# Patient Record
Sex: Male | Born: 1990 | Race: White | Hispanic: No | Marital: Single | Smoking: Current every day smoker
Health system: Southern US, Community
[De-identification: ages and names within clinical notes are randomized; demographics above are authoritative.]

## PROBLEM LIST (undated history)

## (undated) DIAGNOSIS — F329 Major depressive disorder, single episode, unspecified: Secondary | ICD-10-CM

## (undated) DIAGNOSIS — F32A Depression, unspecified: Secondary | ICD-10-CM

## (undated) DIAGNOSIS — F39 Unspecified mood [affective] disorder: Secondary | ICD-10-CM

## (undated) DIAGNOSIS — F909 Attention-deficit hyperactivity disorder, unspecified type: Secondary | ICD-10-CM

## (undated) DIAGNOSIS — F1110 Opioid abuse, uncomplicated: Secondary | ICD-10-CM

## (undated) DIAGNOSIS — F419 Anxiety disorder, unspecified: Secondary | ICD-10-CM

## (undated) HISTORY — DX: Anxiety disorder, unspecified: F41.9

## (undated) HISTORY — DX: Depression, unspecified: F32.A

## (undated) HISTORY — DX: Major depressive disorder, single episode, unspecified: F32.9

---
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
## 2010-08-31 NOTE — H&P (Signed)
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 Betti Cruz for inpatient  stabilization and treatment of suicide risk and depression, complicating  treatment of complex ADHD and substance abuse.  The patient had overdosed  with mother's Klonopin the night before admission, taking five of her  tablets, and then ran away, requiring the police to bring him home.  The  following morning, he was verbally assaultive and destroying properties in  ways that were dangerous to self and others.  He reported two previous  overdoses with mother's pills in the past and had planned to do so again.  He would not contract for safety and reported he was out of control in his  urge for substance abuse.   SYNOPSIS OF PRESENT ILLNESS:  The patient offers little elaboration on the  origin and course of symptoms, even though he presents himself as being in  the top 12% of IQ's around the country.  He reports having severe ADHD, as  diagnosed by Dr. Greig Castilla Proffer, even though his IQ was measured at the top.  The patient does not appear to help himself significantly, even though he  seems proud of his high IQ.  He has had a gradual erosion in his social  organization and behavior so that he is progressively consequential to  family life.  He has overdosed on prednisone, May 06, 2005, and was seen  in [facility] emergency department, indicating he was trying to  hurt himself because he was always causing problems, including for the  family.  He therefore seems to  experience some guilt and remorse for his  actions and to have some insight into the consequences; however, he has not  resolved such.  He was to see Dr. Betti Cruz later that day after being presented  to the emergency department by family.  He has a history of ADHD, substance  abuse, depression.  He has had significant defiant behavior, including  property destruction, running away, and stealing.  At the time of admission,  he is taking Ritalin at 40 mg LA, he has 2 in the morning and 1 at noon.  He  therefore takes a total of 120 mg of Ritalin daily for approximately 1.5  mg/kg per day.  The family indicates that great efforts have been taken by  Dr. Betti Cruz to find the right dose of medication for the patient, and they do  not want the medication changed.  He is also on clonidine 0.1 mg tablet,  taking a half tablet three times daily in the morning, noon, and before  supper, as well as a whole tablet at bedtime.  He also takes Lexapro 20 mg  every morning.  Patient takes little responsibility but exhibits significant  denial relative to symptoms.  He portrays being significantly  psychologically habituated to substance use, even though he mechanically  states he has used only a few times regarding cannabis.  He has apparently  distributed clonazepam at school and been grounded for a month.  His  psychological fixations and compulsions to use are much greater than the  physical compulsions.  He suggests at one time that he has a couple of  cigarettes daily and another time that he has 6-15 cigarettes daily.  He  indicates a desperate desire for a patch; however, with his high dose  Ritalin and complex pharmacotherapy, a potential interaction with the patch  may be significant.  We will check an EKG prior to proceeding.  He  apparently had psychological care with Dr. Inez Pilgrim in the past but sees Dr.  Betti Cruz regularly.  He has had a Public relations account executive at school, Benjie Karvonen.  He does not  acknowledge current misperceptions.  He does not know  acknowledge post-traumatic anxiety or flashbacks.   PAST MEDICAL HISTORY:  The patient is under the primary care of Dr. Ermalinda Barrios.  He had the prednisone overdose treated in the emergency  department on May 06, 2005, at which time his weight was 78.7 kg,  currently 75.5.  He had chickenpox at age 28.  He is sensitive to pepperoni  with dyspepsia.  He is otherwise in good general health.  He has had no  seizure or syncope.  He has had no heart murmur or arrhythmia.  He has no  medication allergies.  There has been no other known organic central nervous  system trauma.   REVIEW OF SYSTEMS:  Patient denies difficulty with gait, gaze, or  continence.  He denies exposure to communicable disease or toxins.  He has  no headache or sensory loss.  He has no memory disturbance or coordination  deficit.  He has no rash, jaundice, or purpura.  There is no chest pain,  palpitations, or presyncope.  There is no abdominal pain, nausea, vomiting,  or diarrhea.  There is no dysuria or arthralgia.  Immunizations are up to  date.   FAMILY HISTORY:  The patient lives with parents and sister.  Mother has  pills in the house, but they do not clarify what and why, except she does  have clonazepam, with which the patient is overdosed.  Family history is  otherwise unremarkable or currently repressed and suppressed.   SOCIAL/DEVELOPMENTAL HISTORY:  Patient is a 10th grade student at [employer].  He offers little clarification of his status at school,  although stating that his ADHD has been severe in the past.  He does not  acknowledge definite legal consequences except that the police brought him  home when he ran away the night before admission without apparent charges.  The patient does not acknowledge sexual activity at this time, although he is not open to answering questions.  He maintains the drug using, social  posture,  seeking Nicoderm patch.   ASSETS:  Patient is intelligent.   MENTAL STATUS EXAM:  Height is 68-1/2 inches, and weight is 75.5 kg.  Blood  pressure is 116/74 with heart rate of 74 sitting and 126/73 with heart rate  of 93 standing.  He is right-handed.  He is regressive and hysteroid, and  his identify to fusion and social manipulation.  He presents initial  moderate-to-severe dysphoria with irritability and impulse control  difficulty.  He has atypical depressive features, although he does present  some inappropriate over-animation at times, it is difficult to distinguish  primary and defensive actions and reactions.  Overall, the patient appears  to have moderate-to-severe atypical major depression.  Patient has suicidal  ideation and planned overdose with mother's pills.  He has a history of  externalizing behavior, including ADHD and ODD.  Communication and respect  antd trust for others and self has been poor with few boundaries for  maintaining safety, and he does not contract for safety relative to suicide  plan to overdose with pills.   IMPRESSION:   AXIS I:  1. 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  2. Attention-deficit/hyperactivity disorder, combined type, severe.  3. Oppositional defiant disorder.  4. Psychoactive substance abuse, not otherwise specified.  5. Identify disorder with hysteroid features.  6. Parent-child problem.  7. Other specified family circumstances.  8. Other interpersonal problems.   AXIS II:  Deferred.   AXIS III:  Cigarette smoking.   AXIS IV:  Stressors, family, severe acute-on-chronic; school, moderate acute-  on-chronic; phase of life, severe acute-on-chronic.   AXIS V:  GAF on admission 36 with highest in last year 67.   PLAN:  Patient is admitted for inpatient adolescent psychiatric and  multidisciplinary multimodal behavioral health treatment in a  team-based  programmatic locked psychiatric unit.  Will  consider reduction of Ritalin,  as indicated, according to black box warnings and clinical course.  The most  important appears to be stabilization of dangerous disruptive behavior and  family function consequences as well as sobriety for resolving factors that  continue to increase depression.  Will check an EKG prior to any  consideration of Nicoderm patch.  Will continue current medications  otherwise initially.  Cognitive behavioral therapy, anger management, family  therapy, substance abuse intervention, social and communication skills,  problem solving and coping skills, identify consolidation, and  individuation/separation can be undertaken.  Estimated length of treatment  is 7-8 days with target symptoms for discharge being stabilization of  suicide risk and mood, stabilization of dangerous disruptive behavior, and  generalization of the capacity for safe, effective participation in outpatient treatment.      Lalla Brothers, MD  Electronically Signed     GEJ/MEDQ  D:  01/14/2006  T:  01/15/2006  Job:  [id number]

## 2010-08-31 NOTE — Discharge Summary (Signed)
NAMEDEQUANE, STRAHAN               ACCOUNT NO.:  [id number]   MEDICAL RECORD NO.:  [id number]          PATIENT TYPE:  INP   LOCATION:  0204                          FACILITY:  [facility]   PHYSICIAN:  Lalla Brothers, MDDATE OF BIRTH:  [birth year]   DATE OF ADMISSION:  06/06/2006  DATE OF DISCHARGE:  06/11/2006                               DISCHARGE SUMMARY   IDENTIFICATION:  A 44-1/20-year-old male tenth grade student at [employer] was admitted emergently voluntarily in  transfer from [facility] Emergency Department for  inpatient stabilization and treatment of suicide risk, dangerous  disruptive intoxication and criminal behavior undermining of mood  disorder vise versa.  The patient had multiple self-inflicted  lacerations on the left dorsal forearm occurring just after being  charged by police for breaking and entering cars for which purpose the  patient states was to look for cigarettes.  The patient indicated on  admission that he was very high and would be hungover the next day.  He  was progressively out of control recently, including being seen in  [facility] Department 2 days before when father caught him  with 2 stacks of pills, being very intoxicated.  For full details,  please see the typed admission assessment.   SYNOPSIS OF PRESENT ILLNESS:  Parents are pleased that the patient was  able to get into [employer] after his last hospitalization and discharge at  [facility] February 05, 2006.  However, parents note that 2  weeks prior to current admission, the patient started refusing school  and refusing to take his proper medication.  He became more agitated and  belligerent, and was smoking more cigarettes and cannabis.  The patient  attempts to abuse mother's Klonopin medication, and to overdose on  Ritalin.  He was expelled from [employer] in [place]  after being found walking down the road  with a firearm, talking about  harming 3 peers from his school who had been bullying him.  In the  interim since that last hospitalization, the patient's Ritalin has again  advanced to 40 mg t.i.d., up from previous b.i.d. at the time of  discharge, and his Lexapro has been restarted now at 20 mg every  morning.  His clonidine is  0.05 mg t.i.d. and 0.1 mg at bedtime, and he  is using Zyprexa 5 mg only p.r.n..  He has grown and gained weight  significantly since October2007 with weight up from 76.5 kg to 90 kg.  His height is up from 174 cm to 176 cm.  The patient reports a right  ankle sprain at the time of admission from playing basketball prior to  admission.  He is clumsy in his agitated manic state, even though he  reports dysphoria, and he has also gained significant weight, putting  additional stress on his ankle.  Father and sister likely have ADHD.  Mother has Klonopin, reporting previous postpartum depression.  Family  has exhausted large sums of money attempting to stabilize the patient's  mental health and behavioral problems  as well as to work out the  consequences.  The patient had reported overdosing with 10 Klonopin of  mother's when he went to the emergency department 2 days prior to his  current admission, but he had no benzodiazepines in his urine drug  screen, only cannabis.  However, at the time of this admission he does  have benzodiazepines as well as cannabis in his urine drug screen.   INITIAL MENTAL STATUS EXAM:  The patient is exhibiting hyperverbal  communication and intrusiveness with little social respect or empathy.  He has high expressed emotion, even about hip-pop music and activity.  The patient is somewhat more mature and capable than was his  interpersonal behavior during his last admission 5 months ago.  However,  patient has been abusing his Ritalin at times as well as mother's  Klonopin.  He has made suicide threats and self-mutilated his left   forearm dorsal aspect.   LABORATORY FINDINGS:  CBC at [facility] was normal with white count  7700, hemoglobin 14, MCV of 80 with reference range 78-92, and platelet  count 228,000.  Initial basic metabolic panel was normal with sodium  141, potassium low at 3.4 with lower limit of normal 3.5, random glucose  110, creatinine 0.7, and calcium 9.6.  A repeat comprehensive metabolic  panel the day before discharge including in comparison to hepatic  function panel 3 days before was normal with sodium 142, potassium 4.5,  glucose 92, CO2 30, creatinine 0.89, total bilirubin 0.9, calcium 9.8,  albumin 3.6, AST 24, ALT 17 and GGT 21.  Hemoglobin A1c was normal at  5.7% with reference range 4.6-6.1.  A 10-hour fasting lipid panel was  mildly abnormal with total cholesterol 189 with upper limit of normal  169, HDL normal at 39, LDL slightly elevated 112 with normal being less  than 109, and triglyceride at 10-hour fasting 191 with normal being less  than 150 for 14-hour fast.  Free T4 was slightly low at 0.85 ng/dL with  reference range 0.45-4.0.  TSH was normal at 1.753 with reference range  0.35-5.5.  Acetaminophen and salicylate have been negative in the ED,  though urine drug screen was positive for tetrahydrocannabinol and  benzodiazepines, while blood alcohol was negative.  Urinalysis was  normal with specific gravity of 1.017 and pH 6.5.  RPR was nonreactive.  Urine probe for gonorrhea and Chlamydia trachomatis by DNA amplification  were both negative.   HOSPITAL COURSE AND TREATMENT:  General medical exam by Jorje Guild, PA-C,  exam of the right ankle and repeated exam several days later ultimately  clearing him for graduated recreational activity on that right ankle.  He reported a 30-pound weight gain in 2 months himself, and his BMI was  29.1, appearing obese.  Ibuprofen was arranged as needed for ankle discomfort and was not often needed.  Vital signs were normal throughout  hospital  stay with height of 176 cm and weight of 90 kg on admission.  Blood pressure initially was 129/77 with heart rate of 82 supine, and  123/77 with heart rate of 92 standing.  At the time of discharge, supine  blood pressure was 117/71 with heart rate of 78, and standing blood  pressure 122/70 with heart rate of 82.  The patient initially was  discontinued from Ritalin LA, and Lexapro was reduced to 10 mg every  morning.  Clonidine was continued and Zyprexa Zydis was gradually  increased to 10 mg every night.  The patient gradually  developed some  insight for consequences of his delinquent behavior though he continued  to be grandiose and expansive about most other aspects of his life.  However, he began to have the capacity to follow the redirection of  others and began to explore his abnormal behaviors as well as the  consequences of missing parts of activity and social exposure that could  have been earned by appropriate behavior.  Family planned mental health  court for the patient's illegal behaviors prior to admission.  However  the treatment staff and program was much more direct in clarifying the  illegal aspect of the patient's behavior and the possible and severe  consequences.  Such could not be verified during the patient's hospital  treatment.  The patient tolerated increased Zyprexa and decreased  Lexapro, followed by restarting a lower dose of the Ritalin LA.  The  patient required no seclusion or restraint during hospital stay, though  he did require as-needed doses of Zyprexa Zydis and a Nicoderm patch 21  mg.  However, on the established regimen, the patient gradually  stabilized sufficiently to resume his out-of-hospital status with  parents, though with extensive family therapy and individual  interventions to stabilize his disruptive behavior.  The patient showed  little or no remorse for his actions, but  he did respect by the time of  discharge the prohibition of a  subsequent similar behavior.  However,  generalization is a major task, though this was initially addressed with  family intervention with mother and father.  The patient's total  cholesterol had risen to 189 from 160 in October2007 with HDL  cholesterol down from 49 to 39, and LDL up from 93 to 112.  The  patient's wounds were 70% healed by the time of discharge, even though  he was refusing wound care most of the time.  His judgment remain poor  as though chronically manic though he had mixed mood features as well,  including some significant intrapsychic dysphoria at times that he  denied.  The patient was ready for discharge, reaching the maximum  hospital benefit though having very significant long-term treatment  needs.  In the work with the family, there was partial acceptance among  family members that the patient needed group home placement, wilderness or boot camp experience.  Family was ambivalent about such, and the  conclusion by all was that for community and family safety, global  necessity is evident for rehabilitation in [facility] to  hopefully be adjucated from his upcoming court proceedings for his  current breaking and entering cars charges.   FINAL DIAGNOSIS:  AXIS I:  1. Bipolar disorder, mixed, moderate to severe.  2. Conduct disorder, adolescent onset.  3. Attention deficit hyperactivity disorder, combined type, moderate      severity.  4. Cannabis abuse.  5. Sedative hypnotic abuse.  6. Noncompliance with treatment.  7. Parent-child problem.  8. Other interpersonal problem.  9. Other specified family circumstances.  AXIS II:  Diagnosis deferred.  AXIS III:  1. Overweight.  2. Cigarette smoking.  3. History of acne.  4. Elevated total and LDL cholesterol.  5. Recent sprain, right ankle, playing basketball prior to admission.  AXIS IV: Stressors family severe, acute and chronic; school severe,  acute and chronic; legal moderate, acute and chronic;  phase of life  severe, acute and chronic.  AXIS V:  Global assessment of functioning on admission was 38 with  highest in last year 58, and discharge global assessment  of functioning  was 48.   PLAN:  The patient was discharged to parents in improved condition, free  of any homicide or suicide ideation.  The patient had some sincerity  about community and school life with the family.  He is discharged on a  weight and fat controlled diet, and will increase activity slowly on his  right ankle, though being able thus far to participate in some social  sports.  Crisis safety plans are outlined if needed.  The patient is  appraised and educated repeatedly what he faces in  upcoming court, as  well as in any return to school.  The patient is discharged on the  following medication:  1. Ritalin LA 40 mg every morning and noon, quantity #60 with no      refill prescribed.  2. Lexapro 10 mg every morning, quantity #30 with no refill      prescribed.  3. Clonidine 0.1 mg to take a 1/2-tablet every morning, noon and      supper, and 1 tablet every bedtime, quantity #75 with no refill      prescribed.  4. Zyprexa Zydis 10 mg every bedtime, quantity #30 with no refill.   He is discharged on medications, having no significant side effects  though his Zyprexa may be prone to weight gain and cholesterol  exacerbation.  Therefore, hopefully Zyprexa Zydis can be decreased  toward discontinuation again starting in 3-4 months.  It may be  necessary to reduce Lexapro further, but the patient's medications are  inconsistent over time due to his cycling mood symptoms and the point in  time of which he enters treatment.Marland Kitchen  He warrants, from mental health and  behavioral health perspecitves, court consequences for his criminal  behavior of [employer] placement in mental health court.  He has psychiatric followup with Dr. Betti Cruz at [phone] at 1400.  He sees Abel Presto  for family and individual therapy  June 12, 2006 at 0 900.  He is to  be absolutely sober and to disengage and abstain from any criminal  behavior.      Lalla Brothers, MD  Electronically Signed     GEJ/MEDQ  D:  06/13/2006  T:  06/14/2006  Job:  [id number]   cc:   Daine Floras, M.D.  Fax: [phone]   Abel Presto, fax [phone]

## 2010-08-31 NOTE — H&P (Signed)
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  Patient had lacerated his dorsal left forearm multiple times just after  being charged by police for breaking and entering cars which the patient  states was for the purpose of looking for cigarettes.  On arrival, the  patient states he is very high and that he will be hung over the  following day.  He had significant excessive ingestion of pills 2 days  before and was in [facility] Emergency Department at that time as well  but released home.  The patient is progressively out-of-control,  endangering self and others.   HISTORY OF PRESENT ILLNESS:  Patient has 2 previous hospitalizations at  [facility] in October 2007.  He had initially begun  treatment at [facility] in May 2007 following overdose with Effexor and  prednisone that initially occurred in January 2007.  The patient has  repeated the same pattern multiple times so that he has had ingestions  of Klonopin necessitating admission in the past.  When in the emergency  department June 04, 2006, father found the patient with 2 stacks of  pills at home being significantly intoxicated.  The patient reported he  had been overdosing with mother's Klonopin however, his urine drug  screen at that time was positive only  for cannabinoids and not  benzodiazepines.  However at the time of this admission in the emergency  department, his urine drug screen is positive for benzodiazepines and  tetrahydrocannabinol. Patient appears to have become progressively out-  of-control in his criminal and substance abusing behavior this likely  consequences for school and family responsibility.  Family has enabled  the patient in the past, particularly by requiring high-dose Ritalin  with which the patient is able to tolerate even larger doses of cannabis  and pills.  Patient was off Lexapro at the time of last discharge and  was down to 40 mg of LA Ritalin twice daily at breakfast and lunch.  Patient is again now at Ritalin 40 mg t.i.d. and is taking Lexapro 20 mg  every morning in addition to clonidine apparently 0.05 mg t.i.d. and 0.1  mg at bedtime.  Several potential doses for clonidine are discussed..  The patient is under the outpatient care of Dr. Betti Cruz at [phone] and  sees Abel Presto for therapy in the same office.  He has worked with them  significantly through the period of time he was last hospitalized as  well.  The patient was inpatient at [facility]  October 15  through February 05, 2006, and also prior to that January 13, 2006 through  January 20, 2006.  Patient had testing and in the past by [facility]  which concluded according to parents that the patient is in the top 12%  of the nation relative to IQ even though his achievement is much lower.  The patient offers little clarification or concern for his criminal  charges which may be felony-related.  He seems to be demanding that  consequences be provided, having little guilt or remorse relative to his  actions including to others.  He had been expelled from [employer] as of his last hospitalization October, 24, 2007, as he had left  home with a gun, walking down the street, planning to retaliate against  3 peers at school who  had been teasing him.  However, the patient is now  in the middle college and [employer] despite expecting he would have to  wait until next fall to start school.  The patient does have some  interest in basketball currently, even to the point of having a scrape  on his knee from playing.  At the time of admission he is reported to be  taking one-half of a 0.1 mg clonidine 3 times daily as also documented  in the emergency room record 2 days ago.  Still, on arrival, father  states that the patient must have his 0.1 mg tablet of clonidine at  bedtime.  The patient is again taking Lexapro 20 mg daily in the morning  despite having stopped Lexapro last admission and been taking Zyprexa  Zydis 15 mg nightly in October 2007 instead.Marland Kitchen  His Ritalin LA 40 mg is  now t.i.d., having been q.i.d. at the time of his first admission to [facility] and b.i.d. when discharge from the second  admission.   PAST MEDICAL HISTORY:  Patient is under the primary care of Dr.  Alita Chyle.  He has an abrasion of the left knee from falling while  playing basketball prior to admission.  He has multiple self-inflicted  lacerations on the left dorsal forearm and wrist.  His right ankle is  sore from playing basketball prior to admission and he has gained weight  over the last 5 months from 76.5 kg to 90 kg.  In October 2007, total  cholesterol was 160 with HDL 49 and LDL 93.  He has a previous fracture  of the right little finger.  He has no medication allergies.  He has had  no seizure or syncope.  He has had no heart murmur or arrhythmia.   REVIEW OF SYSTEMS:  The patient denies difficulty with gait, gaze or  continence except for the painful right ankle from playing basketball  prior to admission.  The patient's excessive weight as well as his  frequent intoxication have resulted in little of the necessary care for the right ankle being provided.  Patient has no known exposure to  communicable  disease or toxins.  He has no headache or sensory loss.  There is no memory loss or coordination deficit though the patient is  disinhibited after he arises from sleep after admission with clumsiness  in carelessness..  The patient has no rash, jaundice or purpura.  There  is no chest pain, palpitations, cough, presyncope or dyspnea.  There is  no abdominal pain, nausea, vomiting or diarrhea.  There is no dysuria or  arthralgia.   Immunizations are up-to-date  FAMILY HISTORY:  Sister was afraid of the patient as of the patient's  last admission when he is walking down the road with the family gun  planning retaliation against 3 peers from [place].  However, instead of  learning from his consequences, the patient has now exhibited breaking  and entering into cars looking for cigarettes with little regard for  likely felony charges.  Father and sister likely have ADHD.  Mother has  Klonopin for symptoms of postpartum depression.  Father seems most  guilty and enabling about the patient's behavior without clarifying how  or why.  Father has diabetes mellitus and high cholesterol.  Patient has  now gained 13.5 kg in the last 5 months.   SOCIAL AND DEVELOPMENTAL HISTORY:  The patient emphasizes that he had no  difficulty getting into [employer] after last discharge.  He  was expelled from [employer] at least until August of this  year.  He indicates that he does enjoy basketball.  The patient reports  using cannabis as a blunt  daily with last use reportedly June 04, 2006.  He maintains that he was intoxicated at the time of the current  crime.  His urine drug screen is positive for benzodiazepine and THC  even though it was negative for benzodiazepines 2 days ago in the ED  when he reported having taken 10 Klonopin apparently of mother's.  Though father acknowledges there were to stacks of other pills.  However, the patient had reported taking 10 Klonopin  apparently of  mother's June 04, 2006.  He reports smoking 1-1/2 packs per day of  cigarettes, and was breaking and entering cars looking for cigarettes so  that he reportedly may have felony charge for that breaking and  entering.  He apparently lacerated his left dorsal forearm which  directed that he would go the hospital rather than other confinement.  The patient does not acknowledge sexual activity.  He offers little  interest in school except to have become accepted to [employer] despite being expelled from [employer] for having a gun and  threatening peers.  He does like basketball.   ASSETS:  The patient is considered intelligent by his family.   MENTAL STATUS EXAM:  Height is 69.3 inches or 176 cm in height; in  October 2007 was 68-1/2 inches.  Weight currently is 90 kg, having been  76.5 kg in October 2007.  Blood pressure is 131/80 with heart rate of 81 sitting and 102/85 with heart rate of 93 standing.  The patient is  unwakable for 2/3 of the morning and then once up he is disinhibited  with hyperverbosity and intrusiveness about social etiquette and rules.  The patient has hysteroid display of high-expressed emotion apparently  for securing drug and delinquent activity he chooses.  The patient  initially offers little remorse or intent to change, though he is more  mature incapable in his interpersonal behavior than during his previous  admission 5 months ago.  The patient predisposes that his  hospitalizations occur at times of external stress though such stress  may be related to cumulative dysfunction in the patient's mood and  behavior as well.  He offers little intent himself to comprehensively  resolve current behaviors and consequences.  Rather, he maintains that  he is intoxicated.  The patient's Ritalin seems to at least partially  served to allow him to use more drugs before he becomes somnolent or  fatigued.  Patient may also use a Ritalin to  facilitate grandiose mood  and behavior.  Every effort is made to be objective about the patient's  needs, although his first need is to be sincere and safe in his  behavior.  He has made suicide threats and self-mutilated his left arm  necessitating admission.  He does not identify any homicidal ideation at  this time, though he did have such in October 2007 at the time of his  last hospitalization.   IMPRESSION:  AXIS I:  1. Mood disorder not otherwise specified.  2. Conduct disorder, adolescent onset.  3. Attention deficit hyperactivity disorder, combined type, moderate      severity.  4. Cannabis abuse.  5. Sedative hypnotic abuse.  6. Noncompliance with treatment.  7. Parent-child problem.  8. Other interpersonal problem.  AXIS II:  Diagnosis deferred.  AXIS III:  1. Overweight.  2. Cigarette smoking.  3. History of acne.  AXIS IV:  Stressors family severe, acute and chronic; school severe,  acute and chronic; legal moderate, acute and chronic; phase of life  severe, acute and chronic.  AXIS V:  Global assessment of functioning on admission is 38 with  highest in last year 58.   PLAN:  The patient is admitted for inpatient adolescent psychiatric and  multidisciplinary multimodal behavioral health treatment in a team-based  problematic locked psychiatric unit.  With the patient's disinhibited  and expansive behavior upon awakening after sleep, the patient's Ritalin  is held while p.r.n. Zyprexa disorder.  His clonidine is ordered as a  half of a 0.1-mg tablet 3 times daily and a whole tablet at bedtime.  His Lexapro is continued at 20 mg every morning and Zyprexa Zydis is  available at 5 mg q.i.d. p.r.n.  Nicoderm patch is established if  needed.  Cognitive behavioral therapy, anger management, criminal  thinking errors, substance abuse intervention, family therapy, social  and communication skills, and individuation separation can be undertaken.  Estimated length of stay  is 4-6 days with target symptoms  for discharge being stabilization of suicide risk and mood,  stabilization of dangerous disruptive behavior and intoxication, and  generalization of the capacity for safe effective participation in court  expectations and consequences.      Lalla Brothers, MD  Electronically Signed     [name]  D:  06/06/2006  T:  06/07/2006  Job:  [id number]
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
## 2010-12-17 ENCOUNTER — Emergency Department (HOSPITAL_COMMUNITY)
Admission: EM | Admit: 2010-12-17 | Discharge: 2010-12-17 | Disposition: A | Discharge status: Home / Self Care | Attending: Emergency Medicine | Admitting: Emergency Medicine

## 2010-12-17 ENCOUNTER — Emergency Department (HOSPITAL_COMMUNITY)

## 2010-12-17 DIAGNOSIS — R110 Nausea: Secondary | ICD-10-CM | POA: Insufficient documentation

## 2010-12-17 DIAGNOSIS — X58XXXA Exposure to other specified factors, initial encounter: Secondary | ICD-10-CM | POA: Insufficient documentation

## 2010-12-17 DIAGNOSIS — R55 Syncope and collapse: Secondary | ICD-10-CM | POA: Insufficient documentation

## 2010-12-17 DIAGNOSIS — F909 Attention-deficit hyperactivity disorder, unspecified type: Secondary | ICD-10-CM | POA: Insufficient documentation

## 2010-12-17 DIAGNOSIS — R0989 Other specified symptoms and signs involving the circulatory and respiratory systems: Secondary | ICD-10-CM | POA: Insufficient documentation

## 2010-12-17 DIAGNOSIS — F329 Major depressive disorder, single episode, unspecified: Secondary | ICD-10-CM | POA: Insufficient documentation

## 2010-12-17 DIAGNOSIS — R0602 Shortness of breath: Secondary | ICD-10-CM | POA: Insufficient documentation

## 2010-12-17 DIAGNOSIS — R0609 Other forms of dyspnea: Secondary | ICD-10-CM | POA: Insufficient documentation

## 2010-12-17 DIAGNOSIS — F3289 Other specified depressive episodes: Secondary | ICD-10-CM | POA: Insufficient documentation

## 2010-12-17 DIAGNOSIS — S20219A Contusion of unspecified front wall of thorax, initial encounter: Secondary | ICD-10-CM | POA: Insufficient documentation

## 2010-12-17 DIAGNOSIS — R071 Chest pain on breathing: Secondary | ICD-10-CM | POA: Insufficient documentation

## 2010-12-17 LAB — CBC
HCT: 45.7 % (ref 39.0–52.0)
Hemoglobin: 16.7 g/dL (ref 13.0–17.0)
MCHC: 36.5 g/dL — ABNORMAL HIGH (ref 30.0–36.0)
MCV: 83.1 fL (ref 78.0–100.0)
RDW: 12.7 % (ref 11.5–15.5)

## 2010-12-17 LAB — DIFFERENTIAL
Eosinophils Relative: 2 % (ref 0–5)
Lymphocytes Relative: 39 % (ref 12–46)
Lymphs Abs: 2.8 10*3/uL (ref 0.7–4.0)
Monocytes Absolute: 0.6 10*3/uL (ref 0.1–1.0)
Monocytes Relative: 8 % (ref 3–12)
Neutro Abs: 3.6 10*3/uL (ref 1.7–7.7)

## 2010-12-17 LAB — POCT I-STAT, CHEM 8
BUN: 18 mg/dL (ref 6–23)
Chloride: 103 mEq/L (ref 96–112)
Creatinine, Ser: 1 mg/dL (ref 0.50–1.35)
Glucose, Bld: 85 mg/dL (ref 70–99)
Potassium: 4 mEq/L (ref 3.5–5.1)
Sodium: 139 mEq/L (ref 135–145)

## 2010-12-17 MED ORDER — IOHEXOL 300 MG/ML  SOLN
80.0000 mL | Freq: Once | INTRAMUSCULAR | Status: AC | PRN
  Administered 2010-12-17: 80 mL via INTRAVENOUS

## 2010-12-18 LAB — RAPID URINE DRUG SCREEN, HOSP PERFORMED
Amphetamines: NOT DETECTED
Barbiturates: NOT DETECTED
Cocaine: NOT DETECTED
Opiates: NOT DETECTED
Tetrahydrocannabinol: NOT DETECTED

## 2011-06-13 ENCOUNTER — Encounter (HOSPITAL_COMMUNITY): Payer: Self-pay | Admitting: *Deleted

## 2011-06-13 ENCOUNTER — Emergency Department (INDEPENDENT_AMBULATORY_CARE_PROVIDER_SITE_OTHER)
Admission: EM | Admit: 2011-06-13 | Discharge: 2011-06-13 | Disposition: A | Discharge status: Home / Self Care | Source: Home / Self Care | Attending: Family Medicine | Admitting: Family Medicine

## 2011-06-13 DIAGNOSIS — J410 Simple chronic bronchitis: Secondary | ICD-10-CM

## 2011-06-13 DIAGNOSIS — J40 Bronchitis, not specified as acute or chronic: Secondary | ICD-10-CM

## 2011-06-13 HISTORY — DX: Unspecified mood (affective) disorder: F39

## 2011-06-13 HISTORY — DX: Attention-deficit hyperactivity disorder, unspecified type: F90.9

## 2011-06-13 MED ORDER — DEXTROMETHORPHAN POLISTIREX 30 MG/5ML PO LQCR: 60.0000 mg | Freq: Two times a day (BID) | ORAL | Status: AC

## 2011-06-13 MED ORDER — DOXYCYCLINE HYCLATE 100 MG PO CAPS: 100.0000 mg | ORAL_CAPSULE | Freq: Two times a day (BID) | ORAL | Status: AC

## 2011-06-13 NOTE — ED Provider Notes (Signed)
History     CSN: [id number]  Arrival date & time 06/13/11  1733   First MD Initiated Contact with Patient 06/13/11 1733      Chief Complaint  Patient presents with  . Cough    (Consider location/radiation/quality/duration/timing/severity/associated sxs/prior treatment) Patient is a 21 y.o. male presenting with cough. The history is provided by the patient.  Cough This is a new problem. The current episode started more than 2 days ago. The problem has been gradually improving. The cough is productive of sputum. There has been no fever. Associated symptoms include rhinorrhea. Pertinent negatives include no sore throat, no shortness of breath and no wheezing. He is a smoker.    Past Medical History  Diagnosis Date  . ADHD (attention deficit hyperactivity disorder)   . Mood disorder     History reviewed. No pertinent past surgical history.  Family History  Problem Relation Age of Onset  . Diabetes Father   . Hypertension Father     History  Substance Use Topics  . Smoking status: Current Everyday Smoker -- 0.5 packs/day  . Smokeless tobacco: Not on file  . Alcohol Use: Yes      Review of Systems  Constitutional: Negative.   HENT: Positive for congestion, rhinorrhea and postnasal drip. Negative for sore throat.   Respiratory: Positive for cough. Negative for shortness of breath and wheezing.   Skin: Negative.     Allergies  Review of patient's allergies indicates no known allergies.  Home Medications   Current Outpatient Rx  Name Route Sig Dispense Refill  . DEXTROMETHORPHAN POLISTIREX ER 30 MG/5ML PO LQCR Oral Take 10 mLs (60 mg total) by mouth 2 (two) times daily. For cough 89 mL 0  . DOXYCYCLINE HYCLATE 100 MG PO CAPS Oral Take 1 capsule (100 mg total) by mouth 2 (two) times daily. 30 capsule 0  . LISDEXAMFETAMINE DIMESYLATE 40 MG PO CAPS Oral Take 40 mg by mouth every morning.    Marland Kitchen OXCARBAZEPINE 150 MG PO TABS Oral Take 300 mg by mouth 1 day or 1 dose.       BP 140/126  Pulse 102  Temp(Src) 99.4 F (37.4 C) (Oral)  Resp 18  SpO2 97%  Physical Exam  Nursing note and vitals reviewed. Constitutional: He is oriented to person, place, and time. He appears well-developed and well-nourished.  HENT:  Head: Normocephalic.  Right Ear: External ear normal.  Left Ear: External ear normal.  Mouth/Throat: Oropharynx is clear and moist.  Eyes: Pupils are equal, round, and reactive to light.  Neck: Normal range of motion. Neck supple.  Pulmonary/Chest: He has no decreased breath sounds. He has no wheezes. He has rhonchi. He has no rales.  Lymphadenopathy:    He has no cervical adenopathy.  Neurological: He is alert and oriented to person, place, and time.  Skin: Skin is warm and dry.  Psychiatric: He has a normal mood and affect.    ED Course  Procedures (including critical care time)  Labs Reviewed - No data to display No results found.   1. Bronchitis due to tobacco use       MDM          Barkley Bruns, MD 06/13/11 [phone]

## 2011-06-13 NOTE — Discharge Instructions (Signed)
Take all of medicine, drink lots of fluids, no more smoking, see your doctor if further problems  °

## 2011-06-13 NOTE — ED Notes (Signed)
States has been congested for "awhile" but since Monday has had cough that causes chest pain and now has sore throat also. Productive cough yesterday

## 2014-12-08 ENCOUNTER — Emergency Department (HOSPITAL_BASED_OUTPATIENT_CLINIC_OR_DEPARTMENT_OTHER)
Admission: EM | Admit: 2014-12-08 | Discharge: 2014-12-08 | Disposition: A | Discharge status: Home / Self Care | Payer: Self-pay | Attending: Emergency Medicine | Admitting: Emergency Medicine

## 2014-12-08 ENCOUNTER — Encounter (HOSPITAL_BASED_OUTPATIENT_CLINIC_OR_DEPARTMENT_OTHER): Payer: Self-pay | Admitting: *Deleted

## 2014-12-08 DIAGNOSIS — F909 Attention-deficit hyperactivity disorder, unspecified type: Secondary | ICD-10-CM | POA: Insufficient documentation

## 2014-12-08 DIAGNOSIS — Z720 Tobacco use: Secondary | ICD-10-CM | POA: Insufficient documentation

## 2014-12-08 DIAGNOSIS — Z79899 Other long term (current) drug therapy: Secondary | ICD-10-CM | POA: Insufficient documentation

## 2014-12-08 DIAGNOSIS — M5416 Radiculopathy, lumbar region: Secondary | ICD-10-CM | POA: Insufficient documentation

## 2014-12-08 MED ORDER — PREDNISONE 50 MG PO TABS
ORAL_TABLET | ORAL | Status: DC
Start: 2014-12-08 — End: 2015-01-16

## 2014-12-08 MED ORDER — METHOCARBAMOL 500 MG PO TABS: 1000.0000 mg | ORAL_TABLET | Freq: Four times a day (QID) | ORAL | Status: DC | PRN

## 2014-12-08 NOTE — ED Notes (Signed)
Back pain for a few months. He does Holiday representative work. He has not seen a doctor due to lack of insurance. Woke with worsened pain this am. Pain radiates down his left leg.

## 2014-12-08 NOTE — ED Provider Notes (Signed)
CSN: [id number]     Arrival date & time 12/08/14  1247 History   First MD Initiated Contact with Patient 12/08/14 1251     Chief Complaint  Patient presents with  . Back Pain     (Consider location/radiation/quality/duration/timing/severity/associated sxs/prior Treatment) HPI   Blood pressure 117/69, pulse 77, temperature 98.5 F (36.9 C), temperature source Oral, resp. rate 18, height 6' (1.829 m), weight 210 lb (95.255 kg), SpO2 98 %.  [name] is a 24 y.o. male complaining of exacerbation of his chronic low back pain this morning. He rates it at 8 out of 10, to mildly alleviated with Goody powders and aspirin. 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 He states that the pain radiates down the posterior of the left leg to the knee. Denies fever, chills, change in bowel or bladder habits, h/o IDVU or cancer, numbness or weakness.    Past Medical History  Diagnosis Date  . ADHD (attention deficit hyperactivity disorder)   . Mood disorder    History reviewed. No pertinent past surgical history. Family History  Problem Relation Age of Onset  . Diabetes Father   . Hypertension Father    Social History  Substance Use Topics  . Smoking status: Current Every Day Smoker -- 0.50 packs/day  . Smokeless tobacco: None  . Alcohol Use: Yes    Review of Systems  10 systems reviewed and found to be negative, except as noted in the HPI.   Allergies  Review of patient's allergies indicates no known allergies.  Home Medications   Prior to Admission medications   Medication Sig Start Date End Date Taking? Authorizing Provider  lisdexamfetamine (VYVANSE) 40 MG capsule Take 40 mg by mouth every morning.    Historical Provider, MD  methocarbamol (ROBAXIN) 500 MG tablet Take 2 tablets (1,000 mg total) by mouth 4 (four) times daily as needed (Pain). 12/08/14   Amiee Wiley, PA-C  OXcarbazepine (TRILEPTAL) 150  MG tablet Take 300 mg by mouth 1 day or 1 dose.    Historical Provider, MD  predniSONE (DELTASONE) 50 MG tablet Take 1 tablet daily with breakfast 12/08/14   Zaydan Papesh, PA-C   BP 117/69 mmHg  Pulse 77  Temp(Src) 98.5 F (36.9 C) (Oral)  Resp 18  Ht 6' (1.829 m)  Wt 210 lb (95.255 kg)  BMI 28.47 kg/m2  SpO2 98% Physical Exam  Constitutional: He appears well-developed and well-nourished.  HENT:  Head: Normocephalic.  Eyes: Conjunctivae are normal.  Neck: Normal range of motion.  Cardiovascular: Normal rate, regular rhythm and intact distal pulses.   Pulmonary/Chest: Effort normal.  Abdominal: Soft. There is no tenderness.  Neurological: He is alert.  No point tenderness to percussion of lumbar spinal processes.  Patient has left lumbar paraspinal muscular spasm with tenderness to palpation. Strength is 5 out of 5 to bilateral lower extremities at hip and knee. No saddle anaesthesia. Patellar reflexes are 2+ bilaterally.    Straight leg raise is positive on the left side at 45   Psychiatric: He has a normal mood and affect.  Nursing note and vitals reviewed.   ED Course  Procedures (including critical care time) Labs Review Labs Reviewed - No data to display  Imaging Review No results found. I have personally reviewed and evaluated these images and lab results as part of my medical decision-making.   EKG Interpretation None      MDM   Final diagnoses:  Lumbar radiculopathy, acute    Filed Vitals:   12/08/14 1254  BP: 117/69  Pulse: 77  Temp: 98.5 F (36.9 C)  TempSrc: Oral  Resp: 18  Height: 6' (1.829 m)  Weight: 210 lb (95.255 kg)  SpO2: 98%    [name] is a pleasant 24 y.o. male presenting with lumbar back pain radiating down the left leg.  back pain.  No neurological deficits and normal neuro exam.  Patient can walk but states is painful.  No loss of bowel or bladder control.  No concern for cauda equina.  No fever, night sweats, weight loss,  h/o cancer, IVDU.  RICE protocol and pain medicine indicated and discussed with patient.  Evaluation does not show pathology that would require ongoing emergent intervention or inpatient treatment. Pt is hemodynamically stable and mentating appropriately. Discussed findings and plan with patient/guardian, who agrees with care plan. All questions answered. Return precautions discussed and outpatient follow up given.   New Prescriptions   METHOCARBAMOL (ROBAXIN) 500 MG TABLET    Take 2 tablets (1,000 mg total) by mouth 4 (four) times daily as needed (Pain).   PREDNISONE (DELTASONE) 50 MG TABLET    Take 1 tablet daily with breakfast         Wynetta Emery, PA-C 12/08/14 1316  Gwyneth Sprout, MD 12/09/14 1018

## 2014-12-08 NOTE — Discharge Instructions (Signed)
For pain control you may take up to  of Motrin (also known as ibuprofen). That is usually 4 over the counter pills,  3 times a day. Take with food to minimize stomach irritation   For breakthrough pain you may take Robaxin. Do not drink alcohol, drive or operate heavy machinery when taking Robaxin.  Do not hesitate to return to the emergency room for any new, worsening or concerning symptoms.  Please obtain primary care using resource guide below. Let them know that you were seen in the emergency room and that they will need to obtain records for further outpatient management.   Lumbosacral Radiculopathy Lumbosacral radiculopathy is a pinched nerve or nerves in the low back (lumbosacral area). When this happens you may have weakness in your legs and may not be able to stand on your toes. You may have pain going down into your legs. There may be difficulties with walking normally. There are many causes of this problem. Sometimes this may happen from an injury, or simply from arthritis or boney problems. It may also be caused by other illnesses such as diabetes. If there is no improvement after treatment, further studies may be done to find the exact cause. DIAGNOSIS  X-rays may be needed if the problems become long standing. Electromyograms may be done. This study is one in which the working of nerves and muscles is studied. HOME CARE INSTRUCTIONS   Applications of ice packs may be helpful. Ice can be used in a plastic bag with a towel around it to prevent frostbite to skin. This may be used every 2 hours for 20 to 30 minutes, or as needed, while awake, or as directed by your caregiver.  Only take over-the-counter or prescription medicines for pain, discomfort, or fever as directed by your caregiver.  If physical therapy was prescribed, follow your caregiver's directions. SEEK IMMEDIATE MEDICAL CARE IF:   You have pain not controlled with medications.  You seem to be getting worse rather  than better.  You develop increasing weakness in your legs.  You develop loss of bowel or bladder control.  You have difficulty with walking or balance, or develop clumsiness in the use of your legs.  You have a fever. MAKE SURE YOU:   Understand these instructions.  Will watch your condition.  Will get help right away if you are not doing well or get worse. Document Released: 04/01/2005 Document Revised: 06/24/2011 Document Reviewed: 11/20/2007 [facility] Patient Information 2015 [place], [place]. This information is not intended to replace advice given to you by your health care provider. Make sure you discuss any questions you have with your health care provider.   Emergency Department Resource Guide 1) Find a Doctor and Pay Out of Pocket Although you won't have to find out who is covered by your insurance plan, it is a good idea to ask around and get recommendations. You will then need to call the office and see if the doctor you have chosen will accept you as a new patient and what types of options they offer for patients who are self-pay. Some doctors offer discounts or will set up payment plans for their patients who do not have insurance, but you will need to ask so you aren't surprised when you get to your appointment.  2) Contact Your Local Health Department Not all health departments have doctors that can see patients for sick visits, but many do, so it is worth a call to see if yours does. If you don't  know where your local health department is, you can check in your phone book. The CDC also has a tool to help you locate your state's health department, and many state websites also have listings of all of their local health departments.  3) Find a [facility] Clinic If your illness is not likely to be very severe or complicated, you may want to try a walk in clinic. These are popping up all over the country in pharmacies, drugstores, and shopping centers. They're usually staffed by  nurse practitioners or physician assistants that have been trained to treat common illnesses and complaints. They're usually fairly quick and inexpensive. However, if you have serious medical issues or chronic medical problems, these are probably not your best option.  No Primary Care Doctor: - Call Health Connect at  [phone] - they can help you locate a primary care doctor that  accepts your insurance, provides certain services, etc. - Physician Referral Service- [phone]  Chronic Pain Problems: Organization         Address  Phone   Notes  [facility]  [phone] Patients need to be referred by their primary care doctor.   Medication Assistance: Organization         Address  Phone   Notes  [facility] Medication [facility] [address] [place], [place] [address] [phone] --Must be a resident of [facility] -- Must have NO insurance coverage whatsoever (no Medicaid/ Medicare, etc.) -- The pt. MUST have a primary care doctor that directs their care regularly and follows them in the community   MedAssist  [phone]   [employer]  [phone]    Agencies that provide inexpensive medical care: Organization         Address  Phone   Notes  [place]  [phone]   [facility]    [phone]   [facility] [place], [place] [address] [phone]   [place] [address], [place] [phone]   Planned Parenthood    [phone]   [facility]    [phone]   [place] and [place] [address], [place] Phone:  [phone], Fax:  [phone] Hours of Operation:  9 am - 6 pm, M-F.  Also accepts Medicaid/Medicare and self-pay.  [facility] for [place], [address], [place] Phone: [phone], Fax: [phone]. Hours of Operation:  8:30  am - 5:30 pm, M-F.  Also accepts Medicaid and self-pay.  [facility] [address], [place] Phone: [phone]   [place], [place], [place] [phone], Ext. [phone] Mondays & Thursdays: 7-9 AM.  First 15 patients are seen on a first come, first serve basis.    [place] Providers:  Organization         Address  Phone   Notes  [facility] [address], [place] [phone] Also accepts self-pay patients.  Eugenio Saenz, [place]  [phone]   [facility], [address], [place] [phone]   [facility] [address], [place] [phone]   Lucianne Lei [address], [place]   [phone] Only accepts [place] Access  Medicaid patients after they have their name applied to their card.   Self-Pay (no insurance) in [facility]:  Organization         Address  Phone   Notes  Sickle Cell Patients, [facility] [place] [phone]   [facility] [place] [phone]   [facility] [place]  [place], [place],  [phone]   [facility]/Dr. Osei-Bonsu  [address], [place] or [address], [place] [phone] Phone number for both [place] and [place] locations is the same.  Urgent Medical and [facility] [address] [place] [phone]   [facility] [address], [place] or [address] [phone]   [facility] [address] [place] [phone], phone; [phone], fax Sees patients 1st and 3rd Saturday of every month.  Must not qualify for public or private insurance (i.e. Medicaid, Medicare, [place] Health  Choice, Veterans' Benefits)  Household income should be no more than 200% of the poverty level The clinic cannot treat you if you are pregnant or think you are pregnant  Sexually transmitted diseases are not treated at the clinic.    Dental Care: Organization         Address  Phone  Notes  [facility] of [facility] [phone] Accepts children up to age 60 who are enrolled in [place] or [place]; pregnant women with a Medicaid card; and children who have applied for Medicaid or [place] Health Choice, but were declined, whose parents can pay a reduced fee at time of service.  [facility] Department of [facility]  [address], [place] [phone] Accepts children up to age 52 who are enrolled in [place] or [place]; pregnant women with a Medicaid card; and children who have applied for Medicaid or [place] Health Choice, but were declined, whose parents can pay a reduced fee at time of service.  [place] Adult Dental Access PROGRAM  [place] [phone] Patients are seen by appointment only. Walk-ins are not accepted. [place] will see patients 20 years of age and older. Monday - Tuesday (8am-5pm) Most Wednesdays (8:30-5pm) $30 per visit, cash only  [facility] Adult Dental Access PROGRAM  [address], [facility] [phone] Patients are seen by appointment only. Walk-ins are not accepted. [place] will see patients 76 years of age and older. One Wednesday Evening (Monthly: Volunteer Based).  $30 per visit, cash only  [place]  [phone] for adults; Children under age 81, call Graduate Pediatric Dentistry at [phone]. Children aged 86-14, please call [phone] to request a pediatric application.  Dental services are provided in all areas of dental care including fillings, crowns and bridges, complete  and partial dentures, implants, gum treatment, root canals, and extractions. Preventive care is also provided. Treatment is provided to both adults and children. Patients are selected via a lottery and there is often a waiting list.   [facility] [address] [place]  [phone] [email or web address], [place], [place] [phone], Ext. [phone] Second and Fourth Thursday of each month, opens at 6:30 AM; Clinic ends at 9 AM.  Patients are seen on a first-come first-served basis, and a limited number are seen during each clinic.   [facility]  [address] Hillard Danker [place], [place] [phone]   Eligibility Requirements You must have lived in [place], [place], or [place] counties for at least the last three months.   You cannot be eligible for state or federal sponsored [employer], including [employer], [place], or [employer].   You generally cannot be eligible for healthcare insurance through your employer.    How to apply: Eligibility screenings are held every Tuesday and Wednesday afternoon from 1:00 pm until 4:00 pm. You do not need an appointment for the interview!  [facility] [address], [place], [place]   [place]  [place] Department  [place]  [phone]    Behavioral Health Resources in the Community: Intensive Outpatient Programs Organization         Address  Phone  Notes  [place]. [address], [place], [place] [phone]   [facility] Outpatient [address], [place], [place]   ADS: Alcohol & Drug Svcs [address] [place], [place]   [place] [address]  [place], [place] or [phone]   Substance Abuse Resources Organization          Address  Phone  Notes  Alcohol and Drug Services  [phone]   [place]  [phone]   [facility] [place]   Chinita Pester  [phone]   Residential & Outpatient Substance Abuse Program  [phone]   Psychological Services Organization         Address  Phone  Notes  [facility] Bertie  [place]  [phone]   [place] [address], [place] or [phone]    Mobile Crisis Teams Organization         Address  Phone  Notes  [facility], Mobile Crisis Care Unit  [phone]   [facility]  [address] [place], [place]   [place] Levels [address] [place] [phone]    Self-Help/Support Groups Organization         Address  Phone             Notes  [place]. of [place] - variety of support groups  [place] Call for more information  Narcotics Anonymous (NA), [facility] [address], [employer] [place]  2 meetings at this location   Special educational needs teacher         Address  Phone  Notes  [facility]AP Residential Treatment [place],    [place]  [phone]   [facility]  [address], [place] [address], [place], [place]   [place], [place] [phone] Admissions: 8am-3pm M-F  Incentives Substance [place] [address],    [place], [place] [phone]   [facility] [address] Jadene Pierini [place], [place]   [facility] [address]  [place], [place]   Insight Programs - Intensive Outpatient [address] Kristeen [address], [place], [place]   [facility]Behavioral Health Department ([place].) [place].,  [place], [place] or [phone]   [facility]idential Treatment Services (RTS) [address] [place], [place]  [phone] Accepts Medicaid  [facility] [address]  [place] [phone] Substance Abuse/Addiction Treatment   [facility] Behavioral Health Resources Organization         Address  Phone  Notes  [facility]  [phone]   Angie FavaJulie Brannon, PhD [address] [place], [place]   [phone]) [phone] or [phone]   [facility]   [address] [place], [place] [phone]   [facility] [address], [place], [place] [phone]) [phone] Insurance/Medicaid/sponsorship through [facility]  [facility] Families [address]                                    [place], [place] [phone]) [phone] Therapy/tele-psych/case  [facility] [address].   [place], [place] [phone]) [phone]    Dr. Lolly MustacheArfeen  [phone]   [facility] [place]  [employer] [facility] 1) [address], [place] 2) [address], [place] 3)  [address], [place] [phone]) [phone]  [phone]) [phone]   [facility] Child Abuse Hotline [phone](336) [phone] or [phone](336) [phone] (After Hours)

## 2014-12-09 NOTE — ED Notes (Signed)
Pt called and requested a work note to return to work with no restriction on 12/14/14.  Returned call to pt and he informed me that he had made an appointment with his pcp and will get the note from them.

## 2014-12-13 ENCOUNTER — Ambulatory Visit (INDEPENDENT_AMBULATORY_CARE_PROVIDER_SITE_OTHER): Payer: Self-pay | Admitting: Medical

## 2014-12-13 ENCOUNTER — Encounter: Payer: Self-pay | Admitting: Medical

## 2014-12-13 VITALS — BP 111/62 | HR 72 | Temp 98.8°F | Ht 73.0 in | Wt 192.6 lb

## 2014-12-13 DIAGNOSIS — M5432 Sciatica, left side: Secondary | ICD-10-CM

## 2014-12-13 MED ORDER — DICLOFENAC SODIUM 75 MG PO TBEC: 75.0000 mg | DELAYED_RELEASE_TABLET | Freq: Two times a day (BID) | ORAL | Status: DC

## 2014-12-13 NOTE — Progress Notes (Signed)
Pre visit review using our clinic review tool, if applicable. No additional management support is needed unless otherwise documented below in the visit note. 

## 2014-12-13 NOTE — Patient Instructions (Signed)
I am writing return to work no restriction at your request. For periodic days moderate to severe pain use diclofenac(after you finish remaining days of predinsone).  I would recommend daily stretching exercises, use support belt daily at work and use proper form lifting.  If your pain gets worse please let me know. Would get xrays and refer you to PT.  Follow up in 7 days or as needed.

## 2014-12-13 NOTE — Progress Notes (Signed)
Subjective:    Patient ID: [name], male    DOB: 1991/01/12, 24 y.o.   MRN: [id number]  HPI  Pt in for first time.  I have reviewed pt PMH, PSH, FH, Social History and Surgical History  Pt went to the ED. He  Went to ED for sciatica. Pain was states no fall or injury recently before he went to ED. Pain states pain just over past 3 months. 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 Pt has pain about 2-3 days a week. Pt points to left SI area as source of pain. Some pain that has pain that shoots down his leg at times. The emergency dept told him to be on light duty until the 31st.  Pt work told him he needed to stay home until he was better. Now work basically tells him he needs a note stating routine to work regularly duty or he won't be able to work.  Pt states ED doctor gave him steroid, muscle relaxant and some tylenol. Pt has not been taking muscle relaxant. He has 2 days left of steroid.   Pt pain level is 8/10 when he has pain(But other days minimal to no pain. On average  level 4 pain). When he walks he gets pain shooting to his leg. No bladder incontinence. No leg weakness. NO foot drop. NO saddle anesthesia. Pt states highest  level pain within last week or 2.   Pt tells me he has no insurance.  Anxiety- pt states he has some mild daily. No diagnose officialy.  Hx of ADHD- But no meds for 6 years.    Review of Systems  Constitutional: Negative for fever, chills and fatigue.  Respiratory: Negative for cough, chest tightness, shortness of breath and wheezing.   Cardiovascular: Negative for chest pain and palpitations.  Musculoskeletal: Positive for back pain.       Lt si area pain.  Skin: Negative for rash.  Neurological: Negative for dizziness, facial asymmetry, speech difficulty, weakness, numbness and headaches.  Hematological: Negative for adenopathy. Does not bruise/bleed easily.  Psychiatric/Behavioral: Negative for behavioral problems and confusion.      Past Medical History  Diagnosis Date  . ADHD (attention deficit hyperactivity disorder)   . Mood disorder   . Anxiety   . Depression     Social History   Social History  . Marital Status: Single    Spouse Name: N/A  . Number of Children: N/A  . Years of Education: N/A   Occupational History  . Not on file.   Social History Main Topics  . Smoking status: Current Every Day Smoker -- 0.50 packs/day  . Smokeless tobacco: Not on file  . Alcohol Use: Yes  . Drug Use: Yes    Special: Marijuana     Comment: has not used in 17 days - states has quit drug use  . Sexual Activity: Not on file   Other Topics Concern  . Not on file   Social History Narrative    No past surgical history on file.  Family History  Problem Relation Age of Onset  . Diabetes Father   . Hypertension Father     No Known Allergies  Current Outpatient Prescriptions on File Prior to Visit  Medication Sig Dispense Refill  . lisdexamfetamine (VYVANSE) 40 MG capsule Take 40 mg by mouth every morning.    . methocarbamol (ROBAXIN) 500 MG tablet Take 2 tablets (1,000 mg total) by mouth 4 (four) times daily as needed (Pain). (Patient  not taking: Reported on 12/13/2014) 20 tablet 0  . OXcarbazepine (TRILEPTAL) 150 MG tablet Take 300 mg by mouth 1 day or 1 dose.    . predniSONE (DELTASONE) 50 MG tablet Take 1 tablet daily with breakfast (Patient not taking: Reported on 12/13/2014) 5 tablet 0   No current facility-administered medications on file prior to visit.    BP 111/62 mmHg  Pulse 72  Temp(Src) 98.8 F (37.1 C) (Oral)  Ht 6\' 1"  (1.854 m)  Wt 192 lb 9.6 oz (87.363 kg)  BMI 25.42 kg/m2  SpO2 98%       Objective:   Physical Exam  General Appearance- Not in acute distress.    Chest and Lung Exam Auscultation: Breath sounds:-Normal. Clear even and unlabored. Adventitious sounds:- No Adventitious sounds.  Cardiovascular Auscultation:Rythm - Regular, rate and rythm. Heart Sounds  -Normal heart sounds.  Abdomen Inspection:-Inspection Normal.  Palpation/Perucssion: Palpation and Percussion of the abdomen reveal- Non Tender, No Rebound tenderness, No rigidity(Guarding) and No Palpable abdominal masses.  Liver:-Normal.  Spleen:- Normal.   Back No mid  lumbar spine tenderness to palpation. Lt si direct tenderness to palpation Pain on straight leg lift. Pain on lateral movements and flexion/extension of the spine.  Lower ext neurologic  L5-S1 sensation intact bilaterally. Normal patellar reflexes bilaterally. No foot drop bilaterally.      Assessment & Plan:  Pt states he needs to go back to work. Can't afford not to work.  I am writing return to work no restriction at your request. For periodic days moderate to severe pain use diclofenac(after you finish remaining days of predinsone).  I would recommend daily stretching exercises, use support belt daily at work and use proper form lifting.  If your pain gets worse please let me know. Would get xrays and refer you to PT.  Follow up in 7 days or as needed.

## 2015-01-16 ENCOUNTER — Emergency Department (HOSPITAL_BASED_OUTPATIENT_CLINIC_OR_DEPARTMENT_OTHER)
Admission: EM | Admit: 2015-01-16 | Discharge: 2015-01-16 | Disposition: A | Discharge status: Home / Self Care | Payer: Self-pay | Attending: Emergency Medicine | Admitting: Emergency Medicine

## 2015-01-16 ENCOUNTER — Encounter (HOSPITAL_BASED_OUTPATIENT_CLINIC_OR_DEPARTMENT_OTHER): Payer: Self-pay | Admitting: *Deleted

## 2015-01-16 DIAGNOSIS — J029 Acute pharyngitis, unspecified: Secondary | ICD-10-CM | POA: Insufficient documentation

## 2015-01-16 DIAGNOSIS — Z720 Tobacco use: Secondary | ICD-10-CM | POA: Insufficient documentation

## 2015-01-16 DIAGNOSIS — Z8659 Personal history of other mental and behavioral disorders: Secondary | ICD-10-CM | POA: Insufficient documentation

## 2015-01-16 LAB — RAPID STREP SCREEN (MED CTR MEBANE ONLY): Streptococcus, Group A Screen (Direct): NEGATIVE

## 2015-01-16 NOTE — ED Provider Notes (Signed)
CSN: [id number]     Arrival date & time 01/16/15  7829 History   First MD Initiated Contact with Patient 01/16/15 [id number]     No chief complaint on file.  HPI   24 year old male presents today with throat discomfort. Patient reports for the last 2 days he's had discomfort in his throat, he describes this as "tingling". He reports the discomfort is worse with swallowing. Patient reports he's had similar symptoms approximately a year ago, was caused by a change in mouthwash. Patient denies difficulty breathing, swallowing, fever, chills, runny nose, watery eyes, drooling, changes in voice, pain of the neck or surrounding soft tissues, cough, shortness of breath, change in living arrangements. 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 He denies any significant history of allergies.   Past Medical History  Diagnosis Date  . ADHD (attention deficit hyperactivity disorder)   . Mood disorder (HCC)   . Anxiety   . Depression    History reviewed. No pertinent past surgical history. Family History  Problem Relation Age of Onset  . Diabetes Father   . Hypertension Father    Social History  Substance Use Topics  . Smoking status: Current Every Day Smoker -- 1.00 packs/day    Types: Cigarettes  . Smokeless tobacco: None  . Alcohol Use: Yes    Review of Systems  All other systems reviewed and are negative.   Allergies  Review of patient's allergies indicates no known allergies.  Home Medications   Prior to Admission medications   Not on File   BP 121/66 mmHg  Pulse 91  Temp(Src) 98.8 F (37.1 C) (Oral)  Resp 16  Ht  (1.854 m)  Wt 200 lb (90.719 kg)  BMI 26.39 kg/m2  SpO2 97% Physical Exam  Constitutional: He is oriented to person, place, and time. He appears well-developed and well-nourished.  HENT:  Head: Normocephalic and atraumatic.  Right Ear: Hearing, tympanic membrane, external ear and ear canal normal.  Left Ear: Hearing,  tympanic membrane, external ear and ear canal normal.  Mouth/Throat: Uvula is midline, oropharynx is clear and moist and mucous membranes are normal. No oropharyngeal exudate, posterior oropharyngeal edema, posterior oropharyngeal erythema or tonsillar abscesses.  Eyes: Conjunctivae are normal. Pupils are equal, round, and reactive to light. Right eye exhibits no discharge. Left eye exhibits no discharge. No scleral icterus.  Neck: Normal range of motion. Neck supple. No JVD present. No tracheal deviation present.  Pulmonary/Chest: Effort normal. No stridor.  Neurological: He is alert and oriented to person, place, and time. Coordination normal.  Psychiatric: He has a normal mood and affect. His behavior is normal. Judgment and thought content normal.  Nursing note and vitals reviewed.   ED Course  Procedures (including critical care time) Labs Review Labs Reviewed  RAPID STREP SCREEN (NOT AT [facility])  CULTURE, GROUP A STREP    Imaging Review No results found. I have personally reviewed and evaluated these images and lab results as part of my medical decision-making.   EKG Interpretation None      MDM   Final diagnoses:  Pharyngitis   Labs:  Imaging:  Consults:  Therapeutics:  Discharge Meds:   Assessment/Plan:  No difficulty swallowing, drooling, dysphonia,muffled voice, stridor, swelling of the neck, trismus, mouth pain, swelling/ pain in submandibular area or floor of mouth ,assymetry of tonsils, or ulcerations; unlikely epiglottitis, PTA, submandibular space infection, retropharyngeal space infection, or HIV. Pt given strict return precautions, PCP follow-up for re-evaluation if  symptoms persist beyond 5-7 days in duration, return to the ED if they worsen. Pt verbalized understanding and agreement to today's plan and had no further questions or concerns at the time of discharge.          Eyvonne Mechanic, PA-C 01/16/15 1038  Cathren Laine, MD 01/16/15 1043

## 2015-01-16 NOTE — ED Notes (Signed)
C/o sorethroat since Saturday. No cough or fever. No other sx.

## 2015-01-16 NOTE — Discharge Instructions (Signed)
Please retention information, please use ibuprofen and Tylenol as needed for discomfort. Please follow up with her primary care provider in one week if symptoms persist, follow-up sooner if symptoms worsen.

## 2015-01-18 LAB — CULTURE, GROUP A STREP

## 2015-05-18 ENCOUNTER — Telehealth: Payer: Self-pay | Admitting: Medical

## 2015-05-18 NOTE — Telephone Encounter (Signed)
LM for pt to call and schedule flu shot or update records. °

## 2016-02-29 ENCOUNTER — Emergency Department (HOSPITAL_BASED_OUTPATIENT_CLINIC_OR_DEPARTMENT_OTHER): Payer: Self-pay

## 2016-02-29 ENCOUNTER — Emergency Department (HOSPITAL_BASED_OUTPATIENT_CLINIC_OR_DEPARTMENT_OTHER)
Admission: EM | Admit: 2016-02-29 | Discharge: 2016-02-29 | Disposition: A | Discharge status: Home / Self Care | Payer: Self-pay | Attending: Emergency Medicine | Admitting: Emergency Medicine

## 2016-02-29 ENCOUNTER — Encounter (HOSPITAL_BASED_OUTPATIENT_CLINIC_OR_DEPARTMENT_OTHER): Payer: Self-pay | Admitting: *Deleted

## 2016-02-29 DIAGNOSIS — Y990 Civilian activity done for income or pay: Secondary | ICD-10-CM | POA: Insufficient documentation

## 2016-02-29 DIAGNOSIS — Y929 Unspecified place or not applicable: Secondary | ICD-10-CM | POA: Insufficient documentation

## 2016-02-29 DIAGNOSIS — F17210 Nicotine dependence, cigarettes, uncomplicated: Secondary | ICD-10-CM | POA: Insufficient documentation

## 2016-02-29 DIAGNOSIS — W231XXA Caught, crushed, jammed, or pinched between stationary objects, initial encounter: Secondary | ICD-10-CM | POA: Insufficient documentation

## 2016-02-29 DIAGNOSIS — F909 Attention-deficit hyperactivity disorder, unspecified type: Secondary | ICD-10-CM | POA: Insufficient documentation

## 2016-02-29 DIAGNOSIS — S9031XA Contusion of right foot, initial encounter: Secondary | ICD-10-CM | POA: Insufficient documentation

## 2016-02-29 DIAGNOSIS — Y939 Activity, unspecified: Secondary | ICD-10-CM | POA: Insufficient documentation

## 2016-02-29 MED ORDER — IBUPROFEN 800 MG PO TABS: 800.0000 mg | ORAL_TABLET | Freq: Three times a day (TID) | ORAL | 0 refills | Status: DC

## 2016-02-29 MED ORDER — IBUPROFEN 800 MG PO TABS
800.0000 mg | ORAL_TABLET | Freq: Once | ORAL | Status: AC
  Administered 2016-02-29: 800 mg via ORAL
  Filled 2016-02-29: qty 1

## 2016-02-29 NOTE — ED Triage Notes (Signed)
Crush injury to his right foot this am at work. States this is not workmans comp. Swelling noted.

## 2016-02-29 NOTE — ED Provider Notes (Signed)
[facility]   CSN: [id number] Arrival date & time: 02/29/16  1159     History   Chief Complaint No chief complaint on file.   HPI [name] is a 25 y.o. male who presents with right foot injury that occurred at work. Patient reports that he caught his foot between 2 beams. Patient has bruising and pain to the medial foot. He denies numbness or tingling. He has not done anything for his injury prior to arrival. Patient reports no other injuries. He denies any chest pain, shortness of breath, abdominal pain, nausea, vomiting, urinary symptoms.  HPI  Past Medical History:  Diagnosis Date  . ADHD (attention deficit hyperactivity disorder)   . Anxiety   . Depression   . Mood disorder (HCC)     There are no active problems to display for this patient.   History reviewed. No pertinent surgical history.     Home Medications    Prior to Admission medications   Medication Sig Start Date End Date Taking? Authorizing Provider  ibuprofen (ADVIL,MOTRIN) 800 MG tablet Take 1 tablet (800 mg total) by mouth 3 (three) times daily. 02/29/16   Emi HolesAlexandra M Kimiye Strathman, PA-C    Family History Family History  Problem Relation Age of Onset  . Diabetes Father   . Hypertension Father     Social History Social History  Substance Use Topics  . Smoking status: Current Every Day Smoker    Packs/day: 1.00    Types: Cigarettes  . Smokeless tobacco: Never Used  . Alcohol use Yes     Allergies   Patient has no known allergies.   Review of Systems Review of Systems  Constitutional: Negative for chills and fever.  HENT: Negative for facial swelling and sore throat.   Respiratory: Negative for shortness of breath.   Cardiovascular: Negative for chest pain.  Gastrointestinal: Negative for abdominal pain, nausea and vomiting.  Genitourinary: Negative for dysuria.  Musculoskeletal: Negative for back pain.  Skin: Positive for color change (ecchymosis to medial  foot). Negative for rash and wound.  Neurological: Negative for headaches.  Psychiatric/Behavioral: The patient is not nervous/anxious.      Physical Exam Updated Vital Signs BP 122/59 (BP Location: Left Arm)   Pulse 73   Temp 98.5 F (36.9 C) (Oral)   Resp 16   Ht 6\' 1"  (1.854 m)   Wt 81.6 kg   SpO2 99%   BMI 23.75 kg/m   Physical Exam  Constitutional: He appears well-developed and well-nourished. No distress.  HENT:  Head: Normocephalic and atraumatic.  Mouth/Throat: Oropharynx is clear and moist. No oropharyngeal exudate.  Eyes: Conjunctivae are normal. Pupils are equal, round, and reactive to light. Right eye exhibits no discharge. Left eye exhibits no discharge. No scleral icterus.  Neck: Normal range of motion. Neck supple. No thyromegaly present.  Cardiovascular: Normal rate, regular rhythm, normal heart sounds and intact distal pulses.  Exam reveals no gallop and no friction rub.   No murmur heard. Pulmonary/Chest: Effort normal and breath sounds normal. No stridor. No respiratory distress. He has no wheezes. He has no rales.  Abdominal: Soft. Bowel sounds are normal. He exhibits no distension. There is no tenderness. There is no rebound and no guarding.  Musculoskeletal: He exhibits no edema.       Right foot: There is tenderness and swelling. There is normal range of motion, normal capillary refill, no deformity and no laceration.       Feet:  R foot: mild  Ecchymosis noted to medial foot, with tenderness overlying; normal sensation; full range of motion with dorsiflexion and plantar flexion and ankle; full range of motion of toes  Lymphadenopathy:    He has no cervical adenopathy.  Neurological: He is alert. Coordination normal.  Skin: Skin is warm and dry. No rash noted. He is not diaphoretic. No pallor.  Psychiatric: He has a normal mood and affect.  Nursing note and vitals reviewed.    ED Treatments / Results  Labs (all labs ordered are listed, but only  abnormal results are displayed) Labs Reviewed - No data to display  EKG  EKG Interpretation None       Radiology Dg Foot Complete Right  Result Date: 02/29/2016 CLINICAL DATA:  Acute right foot pain following crush injury. Initial encounter. EXAM: RIGHT FOOT COMPLETE - 3+ VIEW COMPARISON:  None. FINDINGS: There is no evidence of fracture, subluxation or dislocation. Mild degenerative changes at the fourth toe PIP joint noted. No other joint abnormalities are identified. No focal bony lesions are noted. IMPRESSION: No evidence of acute abnormality. Electronically Signed   By: Harmon PierJeffrey  Hu M.D.   On: 02/29/2016 12:24    Procedures Procedures (including critical care time)  Medications Ordered in ED Medications  ibuprofen (ADVIL,MOTRIN) tablet 800 mg (not administered)     Initial Impression / Assessment and Plan / ED Course  I have reviewed the triage vital signs and the nursing notes.  Pertinent labs & imaging results that were available during my care of the patient were reviewed by me and considered in my medical decision making (see chart for details).  Clinical Course     Patient with contusion to right foot. X-rays negative. Treat with postop shoe and crutches for comfort. Supportive treatment discussed. Follow-up to sports medicine as needed. Discharge home with ibuprofen for pain control. Return precautions discussed. Patient understands and agrees with plan. Patient vitals stable throughout ED course and discharged in satisfactory condition.  Final Clinical Impressions(s) / ED Diagnoses   Final diagnoses:  Contusion of right foot, initial encounter    New Prescriptions New Prescriptions   IBUPROFEN (ADVIL,MOTRIN) 800 MG TABLET    Take 1 tablet (800 mg total) by mouth 3 (three) times daily.     Emi Holeslexandra M Nakea Gouger, PA-C 02/29/16 1252    Lavera Guiseana Duo Liu, MD 02/29/16 1600

## 2016-02-29 NOTE — Discharge Instructions (Signed)
Medications: Ibuprofen  Treatment: Take ibuprofen every 8 hours as needed for your pain. Ice 3-4 times daily alternating 20 minutes on, 20 minutes off. Wear your postop shoe and use crutches and begin to bear weight as tolerated. Keep your foot elevated whenever you're not walking or standing.  Follow-up: Please follow-up with Dr. Pearletha ForgeHudnall if your symptoms are not improving over the next 7-10 days. Please return to emergency department if you develop any new or worsening symptoms.

## 2016-02-29 NOTE — ED Notes (Signed)
Pt verbalized understanding of discharge instructions and denies any further questions at this time.   

## 2016-09-04 ENCOUNTER — Encounter (HOSPITAL_COMMUNITY): Payer: Self-pay

## 2016-09-04 ENCOUNTER — Emergency Department (HOSPITAL_COMMUNITY)
Admission: EM | Admit: 2016-09-04 | Discharge: 2016-09-04 | Disposition: A | Discharge status: Home / Self Care | Payer: Self-pay | Attending: Emergency Medicine | Admitting: Emergency Medicine

## 2016-09-04 DIAGNOSIS — F17210 Nicotine dependence, cigarettes, uncomplicated: Secondary | ICD-10-CM | POA: Insufficient documentation

## 2016-09-04 DIAGNOSIS — F1123 Opioid dependence with withdrawal: Secondary | ICD-10-CM | POA: Insufficient documentation

## 2016-09-04 DIAGNOSIS — F1191 Opioid use, unspecified, in remission: Secondary | ICD-10-CM | POA: Diagnosis present

## 2016-09-04 DIAGNOSIS — F1110 Opioid abuse, uncomplicated: Secondary | ICD-10-CM | POA: Diagnosis present

## 2016-09-04 DIAGNOSIS — F909 Attention-deficit hyperactivity disorder, unspecified type: Secondary | ICD-10-CM | POA: Insufficient documentation

## 2016-09-04 HISTORY — DX: Opioid abuse, uncomplicated: F11.10

## 2016-09-04 LAB — ACETAMINOPHEN LEVEL: Acetaminophen (Tylenol), Serum: <10 ug/mL — ABNORMAL LOW (ref 10–30)

## 2016-09-04 LAB — COMPREHENSIVE METABOLIC PANEL
ALBUMIN: 4.2 g/dL (ref 3.5–5.0)
ALK PHOS: 74 U/L (ref 38–126)
ALT: 18 U/L (ref 17–63)
ANION GAP: 10 (ref 5–15)
AST: 23 U/L (ref 15–41)
BUN: 17 mg/dL (ref 6–20)
CALCIUM: 9.5 mg/dL (ref 8.9–10.3)
CHLORIDE: 98 mmol/L — AB (ref 101–111)
CO2: 27 mmol/L (ref 22–32)
Creatinine, Ser: 1 mg/dL (ref 0.61–1.24)
GFR calc non Af Amer: >60 mL/min (ref >60)
GLUCOSE: 121 mg/dL — AB (ref 65–99)
POTASSIUM: 3.8 mmol/L (ref 3.5–5.1)
Sodium: 135 mmol/L (ref 135–145)
Total Bilirubin: 1.4 mg/dL — ABNORMAL HIGH (ref 0.3–1.2)
Total Protein: 8 g/dL (ref 6.5–8.1)

## 2016-09-04 LAB — CBC WITH DIFFERENTIAL/PLATELET
BASOS PCT: 0 %
Basophils Absolute: 0 10*3/uL (ref 0.0–0.1)
Eosinophils Absolute: 0.1 10*3/uL (ref 0.0–0.7)
Eosinophils Relative: 1 %
HCT: 42.7 % (ref 39.0–52.0)
HEMOGLOBIN: 14.6 g/dL (ref 13.0–17.0)
Lymphocytes Relative: 33 %
Lymphs Abs: 1.8 10*3/uL (ref 0.7–4.0)
MCH: 28.2 pg (ref 26.0–34.0)
MCHC: 34.2 g/dL (ref 30.0–36.0)
MCV: 82.6 fL (ref 78.0–100.0)
MONOS PCT: 7 %
Monocytes Absolute: 0.4 10*3/uL (ref 0.1–1.0)
NEUTROS ABS: 3.2 10*3/uL (ref 1.7–7.7)
NEUTROS PCT: 59 %
PLATELETS: 196 10*3/uL (ref 150–400)
RBC: 5.17 MIL/uL (ref 4.22–5.81)
RDW: 13.1 % (ref 11.5–15.5)
WBC: 5.4 10*3/uL (ref 4.0–10.5)

## 2016-09-04 LAB — RAPID URINE DRUG SCREEN, HOSP PERFORMED
Amphetamines: NOT DETECTED
Barbiturates: NOT DETECTED
Benzodiazepines: NOT DETECTED
Cocaine: NOT DETECTED
OPIATES: POSITIVE — AB
TETRAHYDROCANNABINOL: POSITIVE — AB

## 2016-09-04 LAB — SALICYLATE LEVEL

## 2016-09-04 LAB — ETHANOL

## 2016-09-04 MED ORDER — IBUPROFEN 200 MG PO TABS: 600.0000 mg | ORAL_TABLET | Freq: Three times a day (TID) | ORAL | Status: DC | PRN

## 2016-09-04 MED ORDER — LOPERAMIDE HCL 2 MG PO CAPS: 2.0000 mg | ORAL_CAPSULE | ORAL | Status: DC | PRN

## 2016-09-04 MED ORDER — NAPROXEN 500 MG PO TABS: 500.0000 mg | ORAL_TABLET | Freq: Two times a day (BID) | ORAL | Status: DC | PRN

## 2016-09-04 MED ORDER — DICYCLOMINE HCL 20 MG PO TABS: 20.0000 mg | ORAL_TABLET | Freq: Four times a day (QID) | ORAL | Status: DC | PRN

## 2016-09-04 MED ORDER — HYDROXYZINE HCL 25 MG PO TABS: 25.0000 mg | ORAL_TABLET | Freq: Four times a day (QID) | ORAL | Status: DC | PRN

## 2016-09-04 MED ORDER — CLONIDINE HCL 0.1 MG PO TABS
0.1000 mg | ORAL_TABLET | Freq: Four times a day (QID) | ORAL | Status: DC
  Administered 2016-09-04: 0.1 mg via ORAL
  Filled 2016-09-04: qty 1

## 2016-09-04 MED ORDER — ACETAMINOPHEN 325 MG PO TABS: 650.0000 mg | ORAL_TABLET | ORAL | Status: DC | PRN

## 2016-09-04 MED ORDER — ONDANSETRON HCL 4 MG PO TABS: 4.0000 mg | ORAL_TABLET | Freq: Three times a day (TID) | ORAL | Status: DC | PRN

## 2016-09-04 MED ORDER — CLONIDINE HCL 0.1 MG PO TABS: 0.1000 mg | ORAL_TABLET | Freq: Two times a day (BID) | ORAL | Status: DC

## 2016-09-04 MED ORDER — ONDANSETRON 4 MG PO TBDP: 4.0000 mg | ORAL_TABLET | Freq: Four times a day (QID) | ORAL | Status: DC | PRN

## 2016-09-04 MED ORDER — METHOCARBAMOL 500 MG PO TABS: 500.0000 mg | ORAL_TABLET | Freq: Three times a day (TID) | ORAL | Status: DC | PRN

## 2016-09-04 MED ORDER — CLONIDINE HCL 0.1 MG PO TABS: 0.1000 mg | ORAL_TABLET | Freq: Every day | ORAL | Status: DC

## 2016-09-04 NOTE — ED Triage Notes (Signed)
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  Wants to get rehab

## 2016-09-04 NOTE — ED Notes (Signed)
Bed: [facility] Expected date:  Expected time:  Means of arrival:  Comments: 

## 2016-09-04 NOTE — BH Assessment (Signed)
TTS ordered for this patient. Per notes, [facility]. Patient stating that she was given these [facility]. Writer contacted [facility] whom [facility] nor has a bed for this patient. Writer discussed with Dr. Shaune Pollackameron Isaacs that [facility] is [facility]. Patient to be discharged home with referrals to local detox facilities. Writer also discussed case with Nanine MeansJamison Lord, DNP who agreed with plan of care with Dr. Crista CurbLiu, Dana whom agreed to terminate the TTS order.

## 2016-09-04 NOTE — ED Provider Notes (Signed)
[facility]-EMERGENCY DEPT Provider Note   CSN: [id number] Arrival date & time: 09/04/16  [id number]     History   Chief Complaint Chief Complaint  Patient presents with  . Drug Problem    HPI [name] is a 26 y.o. male.  The history is provided by the patient.  Drug Problem  This is a chronic problem. The problem occurs constantly. The problem has not changed since onset.Pertinent negatives include no chest pain and no shortness of breath. Nothing aggravates the symptoms. Nothing relieves the symptoms. He has tried nothing for the symptoms.   Presents with request to detox from heroin. Reports being daily user, and now wants to detox and get his life back together. States he has lost his job, living with parents, and his wife is now 6 months pregnant. States he wants to get his life back together. Last use of heroin this morning. Denies any withdrawal symptoms currently. Does drink etoh occasionally, last drink yesterday evening. Also endorses marijuana abuse. Denies fever, cough, dyspnea, chest pain, n/v/d, body aches, chills. Denies SI/HI or AVH. [facility] who told him to come to ED first for medical clearance.  Past Medical History:  Diagnosis Date  . ADHD (attention deficit hyperactivity disorder)   . Anxiety   . Depression   . Heroin abuse 09/04/2016  . Mood disorder [facility](HCC)     Patient Active Problem List   Diagnosis Date Noted  . Heroin abuse 09/04/2016    History reviewed. No pertinent surgical history.     Home Medications    Prior to Admission medications   Medication Sig Start Date End Date Taking? Authorizing Provider  ibuprofen (ADVIL,MOTRIN) 800 MG tablet Take 1 tablet (800 mg total) by mouth 3 (three) times daily. Patient not taking: Reported on 09/04/2016 02/29/16   Emi HolesLaw, Alexandra M, PA-C    Family History Family History  Problem Relation Age of Onset  . Diabetes Father   . Hypertension Father     Social History Social History  Substance  Use Topics  . Smoking status: Current Every Day Smoker    Packs/day: 1.00    Types: Cigarettes  . Smokeless tobacco: Never Used  . Alcohol use Yes     Comment: daily     Allergies   Patient has no known allergies.   Review of Systems Review of Systems  Constitutional: Negative for fever.  Respiratory: Negative for shortness of breath.   Cardiovascular: Negative for chest pain.  Gastrointestinal: Negative for diarrhea, nausea and vomiting.  Psychiatric/Behavioral: Negative for hallucinations and suicidal ideas.  All other systems reviewed and are negative.    Physical Exam Updated Vital Signs BP (!) 144/86 (BP Location: Right Arm)   Pulse 75   Temp 98.3 F (36.8 C) (Oral)   Resp 18   SpO2 96%   Physical Exam Physical Exam  Nursing note and vitals reviewed. Constitutional:  non-toxic, and in no acute distress Head: Normocephalic and atraumatic.  Mouth/Throat: Oropharynx is clear and moist.  Neck: Normal range of motion. Neck supple.  Cardiovascular: Normal rate and regular rhythm.   Pulmonary/Chest: Effort normal and breath sounds normal.  Abdominal: Soft. There is no tenderness. There is no rebound and no guarding.  Musculoskeletal: Normal range of motion.  Neurological: Alert, no facial droop, fluent speech, moves all extremities symmetrically Skin: Skin is warm and dry.  Psychiatric: Cooperative   ED Treatments / Results  Labs (all labs ordered are listed, but only abnormal results are displayed) Labs Reviewed  RAPID URINE DRUG SCREEN, HOSP PERFORMED - Abnormal; Notable for the following:       Result Value   Opiates POSITIVE (*)    Tetrahydrocannabinol POSITIVE (*)    All other components within normal limits  COMPREHENSIVE METABOLIC PANEL - Abnormal; Notable for the following:    Chloride 98 (*)    Glucose, Bld 121 (*)    Total Bilirubin 1.4 (*)    All other components within normal limits  ACETAMINOPHEN LEVEL - Abnormal; Notable for the following:      Acetaminophen (Tylenol), Serum <10 (*)    All other components within normal limits  CBC WITH DIFFERENTIAL/PLATELET  SALICYLATE LEVEL  ETHANOL    EKG  EKG Interpretation None       Radiology No results found.  Procedures Procedures (including critical care time)  Medications Ordered in ED Medications  acetaminophen (TYLENOL) tablet 650 mg (not administered)  ondansetron (ZOFRAN) tablet 4 mg (not administered)  cloNIDine (CATAPRES) tablet 0.1 mg (0.1 mg Oral Given 09/04/16 1148)    Followed by  cloNIDine (CATAPRES) tablet 0.1 mg (not administered)    Followed by  cloNIDine (CATAPRES) tablet 0.1 mg (not administered)  dicyclomine (BENTYL) tablet 20 mg (not administered)  hydrOXYzine (ATARAX/VISTARIL) tablet 25 mg (not administered)  loperamide (IMODIUM) capsule 2-4 mg (not administered)  methocarbamol (ROBAXIN) tablet 500 mg (not administered)  naproxen (NAPROSYN) tablet 500 mg (not administered)  ondansetron (ZOFRAN-ODT) disintegrating tablet 4 mg (not administered)     Initial Impression / Assessment and Plan / ED Course  I have reviewed the triage vital signs and the nursing notes.  Pertinent labs & imaging results that were available during my care of the patient were reviewed by me and considered in my medical decision making (see chart for details).    Presenting with request to detox from heroin. He is well-appearing and in no acute distress with normal vital signs. Currently no symptoms of withdrawal, but last use was this morning. Spoke with TTS, who reports that they do not offer heroin detox. He is no suicidal or homicidal thoughts, and no abnormal thought patterns, and at this time does not require any evaluation for psychiatric illness or inpatient psychiatric placement. I have provided resources for patient for heroin detox. Strict return and follow-up instructions reviewed. He expressed understanding of all discharge instructions and felt comfortable with  the plan of care.   Final Clinical Impressions(s) / ED Diagnoses   Final diagnoses:  Heroin abuse    New Prescriptions Current Discharge Medication List       Lavera Guise, MD 09/04/16 1248

## 2016-09-04 NOTE — Discharge Instructions (Signed)
Please call resources provided to see if you will be accepted to facility for detox.  Return for worsening symptoms, including intractable vomiting, confusion, feeling unsafe or thoughts of hurting self, or any other symptoms concerning to  You.  To help you maintain a sober lifestyle, a substance abuse treatment program may be beneficial to you.  Contact one of the following facilities at your earliest opportunity to ask about enrolling:  RESIDENTIAL PROGRAMS:       [facility]      [address]      [place], [place] [address]      [phone]       [facility]      [address]      [place], [place] [address]      [phone]       [facility]      [address]      [place], [place] [address]      [phone]  OUTPATIENT PROGRAMS:       [facility]      [address]      [place], [place] [address]      [phone]      New patients are seen at the walk-in clinic every Tuesday from 9:00 am - 12:00 pm.

## 2016-09-04 NOTE — BH Assessment (Signed)
[facility] Assessment Progress Note  Per Nanine MeansJamison Lord, DNP, this pt does not require psychiatric hospitalization at this time.  [facility] abuse treatment resources.  These have been included in pt's discharge instructions.  Pt's nurse, Lincoln MaxinOlivette, has been notified.  Doylene Canninghomas Komal Stangelo, MA Triage Specialist [phone]

## 2016-09-04 NOTE — Progress Notes (Signed)
Pt presents anxious /fidgety on initial approach. A & O X4. Denies SI, HI, AVH and pain. Per pt "I "I just need help to detox, get cleaned, I got a baby on the way, I don't want to hurt myself or nothing, I need help". Ambulatory with a steady gait. H/O polysubstance abuse "I use Xenax, weed and cocaine". Pt stated he last used Xenax last night, cocaine this morning and weed couple of days ago. Support and encouragement provided. Q 15 minutes safety checks initiated.

## 2018-10-11 ENCOUNTER — Emergency Department (HOSPITAL_BASED_OUTPATIENT_CLINIC_OR_DEPARTMENT_OTHER)
Admission: EM | Admit: 2018-10-11 | Discharge: 2018-10-11 | Disposition: A | Discharge status: Home / Self Care | Payer: Self-pay | Attending: Emergency Medicine | Admitting: Emergency Medicine

## 2018-10-11 ENCOUNTER — Other Ambulatory Visit: Payer: Self-pay

## 2018-10-11 ENCOUNTER — Encounter (HOSPITAL_BASED_OUTPATIENT_CLINIC_OR_DEPARTMENT_OTHER): Payer: Self-pay | Admitting: *Deleted

## 2018-10-11 DIAGNOSIS — R112 Nausea with vomiting, unspecified: Secondary | ICD-10-CM | POA: Insufficient documentation

## 2018-10-11 DIAGNOSIS — F17210 Nicotine dependence, cigarettes, uncomplicated: Secondary | ICD-10-CM | POA: Insufficient documentation

## 2018-10-11 DIAGNOSIS — R1084 Generalized abdominal pain: Secondary | ICD-10-CM | POA: Insufficient documentation

## 2018-10-11 LAB — CBC WITH DIFFERENTIAL/PLATELET
Abs Immature Granulocytes: 0.03 10*3/uL (ref 0.00–0.07)
Basophils Absolute: 0 10*3/uL (ref 0.0–0.1)
Basophils Relative: 0 %
Eosinophils Absolute: 0 10*3/uL (ref 0.0–0.5)
Eosinophils Relative: 0 %
HCT: 45.8 % (ref 39.0–52.0)
Hemoglobin: 15.6 g/dL (ref 13.0–17.0)
Immature Granulocytes: 0 %
Lymphocytes Relative: 22 %
Lymphs Abs: 1.6 10*3/uL (ref 0.7–4.0)
MCH: 28.4 pg (ref 26.0–34.0)
MCHC: 34.1 g/dL (ref 30.0–36.0)
MCV: 83.4 fL (ref 80.0–100.0)
Monocytes Absolute: 0.4 10*3/uL (ref 0.1–1.0)
Monocytes Relative: 6 %
Neutro Abs: 5.2 10*3/uL (ref 1.7–7.7)
Neutrophils Relative %: 72 %
Platelets: 228 10*3/uL (ref 150–400)
RBC: 5.49 MIL/uL (ref 4.22–5.81)
RDW: 13.2 % (ref 11.5–15.5)
WBC: 7.2 10*3/uL (ref 4.0–10.5)
nRBC: 0 % (ref 0.0–0.2)

## 2018-10-11 LAB — COMPREHENSIVE METABOLIC PANEL
ALT: 23 U/L (ref 0–44)
AST: 23 U/L (ref 15–41)
Albumin: 4.3 g/dL (ref 3.5–5.0)
Alkaline Phosphatase: 87 U/L (ref 38–126)
Anion gap: 15 (ref 5–15)
BUN: 17 mg/dL (ref 6–20)
CO2: 20 mmol/L — ABNORMAL LOW (ref 22–32)
Calcium: 9.5 mg/dL (ref 8.9–10.3)
Chloride: 101 mmol/L (ref 98–111)
Creatinine, Ser: 1.1 mg/dL (ref 0.61–1.24)
GFR calc Af Amer: >60 mL/min (ref >60)
GFR calc non Af Amer: >60 mL/min (ref >60)
Glucose, Bld: 86 mg/dL (ref 70–99)
Potassium: 3.4 mmol/L — ABNORMAL LOW (ref 3.5–5.1)
Sodium: 136 mmol/L (ref 135–145)
Total Bilirubin: 0.9 mg/dL (ref 0.3–1.2)
Total Protein: 7.8 g/dL (ref 6.5–8.1)

## 2018-10-11 LAB — URINALYSIS, ROUTINE W REFLEX MICROSCOPIC
Glucose, UA: NEGATIVE mg/dL
Hgb urine dipstick: NEGATIVE
Ketones, ur: 15 mg/dL — AB
Nitrite: NEGATIVE
Protein, ur: 30 mg/dL — AB
Specific Gravity, Urine: >1.03 — ABNORMAL HIGH (ref 1.005–1.030)
pH: 6 (ref 5.0–8.0)

## 2018-10-11 LAB — URINALYSIS, MICROSCOPIC (REFLEX)

## 2018-10-11 LAB — LIPASE, BLOOD: Lipase: 31 U/L (ref 11–51)

## 2018-10-11 MED ORDER — ONDANSETRON 4 MG PO TBDP: 4.0000 mg | ORAL_TABLET | Freq: Three times a day (TID) | ORAL | 0 refills | Status: DC | PRN

## 2018-10-11 MED ORDER — DICYCLOMINE HCL 20 MG PO TABS: 20.0000 mg | ORAL_TABLET | Freq: Two times a day (BID) | ORAL | 0 refills | Status: DC

## 2018-10-11 MED ORDER — ONDANSETRON 4 MG PO TBDP
4.0000 mg | ORAL_TABLET | Freq: Once | ORAL | Status: AC
  Administered 2018-10-11: 4 mg via ORAL
  Filled 2018-10-11: qty 1

## 2018-10-11 MED ORDER — DICYCLOMINE HCL 10 MG PO CAPS
10.0000 mg | ORAL_CAPSULE | Freq: Once | ORAL | Status: AC
  Administered 2018-10-11: 10 mg via ORAL
  Filled 2018-10-11: qty 1

## 2018-10-11 NOTE — ED Notes (Signed)
Patient verbalizes understanding of discharge instructions. Opportunity for questioning and answers were provided. Armband removed by staff, pt discharged from ED.  

## 2018-10-11 NOTE — Discharge Instructions (Signed)
Please read and follow all provided instructions.  Your diagnoses today include:  1. Generalized abdominal pain     Tests performed today include:  Blood counts and electrolytes  Blood tests to check liver and kidney function  Blood tests to check pancreas function  Urine test to look for infection  Vital signs. See below for your results today.   Medications prescribed:   Zofran (ondansetron) - for nausea and vomiting   Bentyl - medication for intestinal cramps and spasms  Take any prescribed medications only as directed.  Home care instructions:   Follow any educational materials contained in this packet.  Follow-up instructions: Please follow-up with your primary care provider in the next 3 days for further evaluation of your symptoms.    Return instructions:  SEEK IMMEDIATE MEDICAL ATTENTION IF:  The pain does not go away or becomes severe   A temperature above 101F develops   Repeated vomiting occurs (multiple episodes)   The pain becomes localized to portions of the abdomen. The right side could possibly be appendicitis. In an adult, the left lower portion of the abdomen could be colitis or diverticulitis.   Blood is being passed in stools or vomit (bright red or black tarry stools)   You develop chest pain, difficulty breathing, dizziness or fainting, or become confused, poorly responsive, or inconsolable (young children)  If you have any other emergent concerns regarding your health  Additional Information: Abdominal (belly) pain can be caused by many things. Your caregiver performed an examination and possibly ordered blood/urine tests and imaging (CT scan, x-rays, ultrasound). Many cases can be observed and treated at home after initial evaluation in the emergency department. Even though you are being discharged home, abdominal pain can be unpredictable. Therefore, you need a repeated exam if your pain does not resolve, returns, or worsens. Most patients  with abdominal pain don't have to be admitted to the hospital or have surgery, but serious problems like appendicitis and gallbladder attacks can start out as nonspecific pain. Many abdominal conditions cannot be diagnosed in one visit, so follow-up evaluations are very important.  Your vital signs today were: BP (!) 133/101 (BP Location: Right Arm)    Pulse 93    Temp 99 F (37.2 C) (Oral)    Resp 20    Ht 6\' 1"  (1.854 m)    Wt 90.7 kg    SpO2 97%    BMI 26.39 kg/m  If your blood pressure (bp) was elevated above 135/85 this visit, please have this repeated by your doctor within one month. --------------

## 2018-10-11 NOTE — ED Provider Notes (Signed)
[facility] EMERGENCY DEPARTMENT Provider Note   CSN: [id number] Arrival date & time: 10/11/18  1344     History   Chief Complaint Chief Complaint  Patient presents with  . Abdominal Pain    HPI [name] is a 28 y.o. male.     Patient with no past surgical history, history of opioid abuse currently on methadone 105 mg daily --presents to the emergency department with complaint of middle abdominal pain.  Patient developed intense cramping 2 nights ago.  He also had chills and diaphoresis.  He states that he felt so bad that he used heroin because he did not know what else to do.  Prior to this he had been sober for approximately 1.5 years.  Use of heroin helped for about 5 minutes only.  Patient has had intermittent symptoms.  Pain is mainly around the periumbilical area and mid abdomen.  It does not radiate.  Patient had one episode of vomiting yesterday morning.  Otherwise he has just been nauseous.  No diarrhea or constipation.  No urinary symptoms.  No fevers, chest pain, shortness of breath.  No sick contacts or coronavirus contacts.  Denies alcohol.  He does smoke marijuana.  States that he has had no recent adjustments or missed doses of methadone.      Past Medical History:  Diagnosis Date  . ADHD (attention deficit hyperactivity disorder)   . Anxiety   . Depression   . Heroin abuse (HCC) 09/04/2016  . Mood disorder [facility](HCC)     Patient Active Problem List   Diagnosis Date Noted  . Heroin abuse (HCC) 09/04/2016    History reviewed. No pertinent surgical history.      Home Medications    Prior to Admission medications   Medication Sig Start Date End Date Taking? Authorizing Provider  METHADONE HCL PO Take 105 mg by mouth daily.   Yes [provider]  ibuprofen (ADVIL,MOTRIN) 800 MG tablet Take 1 tablet (800 mg total) by mouth 3 (three) times daily. Patient not taking: Reported on 09/04/2016 02/29/16   Emi HolesLaw, Alexandra M, PA-C    Family  History Family History  Problem Relation Age of Onset  . Diabetes Father   . Hypertension Father     Social History Social History   Tobacco Use  . Smoking status: Current Every Day Smoker    Packs/day: 0.50    Types: Cigarettes  . Smokeless tobacco: Former Engineer, [employer]  Substance Use Topics  . Alcohol use: Not Currently    Comment: daily  . Drug use: Yes    Types: Marijuana    Comment: former heroin     Allergies   Patient has no known allergies.   Review of Systems Review of Systems  Constitutional: Positive for chills and diaphoresis. Negative for fever.  HENT: Negative for rhinorrhea and sore throat.   Eyes: Negative for redness.  Respiratory: Negative for cough.   Cardiovascular: Negative for chest pain.  Gastrointestinal: Positive for abdominal pain, nausea and vomiting. Negative for diarrhea.  Genitourinary: Negative for dysuria.  Musculoskeletal: Negative for myalgias.  Skin: Negative for rash.  Neurological: Negative for headaches.     Physical Exam Updated Vital Signs BP (!) 133/101 (BP Location: Right Arm)   Pulse 93   Temp 99 F (37.2 C) (Oral)   Resp 20   Ht 6\' 1"  (1.854 m)   Wt 90.7 kg   SpO2 97%   BMI 26.39 kg/m   Physical Exam Vitals signs and nursing note reviewed.  Constitutional:      Appearance: He is well-developed.  HENT:     Head: Normocephalic and atraumatic.  Eyes:     General:        Right eye: No discharge.        Left eye: No discharge.     Conjunctiva/sclera: Conjunctivae normal.  Neck:     Musculoskeletal: Normal range of motion and neck supple.  Cardiovascular:     Rate and Rhythm: Normal rate and regular rhythm.     Heart sounds: Normal heart sounds.  Pulmonary:     Effort: Pulmonary effort is normal.     Breath sounds: Normal breath sounds.  Abdominal:     Palpations: Abdomen is soft.     Tenderness: There is abdominal tenderness (Mild tenderness) in the right lower quadrant, periumbilical area and suprapubic area.  There is no guarding or rebound. Negative signs include Murphy's sign and McBurney's sign.     Hernia: No hernia is present.  Skin:    General: Skin is warm and dry.  Neurological:     Mental Status: He is alert.      ED Treatments / Results  Labs (all labs ordered are listed, but only abnormal results are displayed) Labs Reviewed  COMPREHENSIVE METABOLIC PANEL - Abnormal; Notable for the following components:      Result Value   Potassium 3.4 (*)    CO2 20 (*)    All other components within normal limits  URINALYSIS, ROUTINE W REFLEX MICROSCOPIC - Abnormal; Notable for the following components:   Color, Urine ORANGE (*)    APPearance CLOUDY (*)    Specific Gravity, Urine >1.030 (*)    Bilirubin Urine MODERATE (*)    Ketones, ur 15 (*)    Protein, ur 30 (*)    Leukocytes,Ua TRACE (*)    All other components within normal limits  URINALYSIS, MICROSCOPIC (REFLEX) - Abnormal; Notable for the following components:   Bacteria, UA RARE (*)    All other components within normal limits  CBC WITH DIFFERENTIAL/PLATELET  LIPASE, BLOOD    EKG    Radiology No results found.  Procedures Procedures (including critical care time)  Medications Ordered in ED Medications  ondansetron (ZOFRAN-ODT) disintegrating tablet 4 mg (4 mg Oral Given 10/11/18 1430)  dicyclomine (BENTYL) capsule 10 mg (10 mg Oral Given 10/11/18 1430)     Initial Impression / Assessment and Plan / ED Course  I have reviewed the triage vital signs and the nursing notes.  Pertinent labs & imaging results that were available during my care of the patient were reviewed by me and considered in my medical decision making (see chart for details).        Patient seen and examined. Work-up initiated. Medications ordered.  Patient appears well and is comfortable.  Abdominal exam is not very impressive and he does not have any focal tenderness.  Will check lab work.  Consider imaging if labs are abnormal, otherwise we  will treat symptomatically.  Vital signs reviewed and are as follows: BP (!) 133/101 (BP Location: Right Arm)   Pulse 93   Temp 99 F (37.2 C) (Oral)   Resp 20   Ht 6\' 1"  (1.854 m)   Wt 90.7 kg   SpO2 97%   BMI 26.39 kg/m    3:16 PM lab work is reassuring.  Patient updated on results.  Plan for discharged home with Zofran and Bentyl for symptomatic control.  Discussed bland diet. The patient was urged to  return to the Emergency Department immediately with worsening of current symptoms, worsening abdominal pain, persistent vomiting, blood noted in stools, fever, or any other concerns. The patient verbalized understanding.   Final Clinical Impressions(s) / ED Diagnoses   Final diagnoses:  Generalized abdominal pain   Patient with abdominal pain. Vitals are stable, no fever. Labs reassuring, concentrated urine. Imaging not indicated. No signs of severe dehydration, patient is tolerating PO's. Lungs are clear and no signs suggestive of PNA. Low concern for appendicitis, cholecystitis, pancreatitis, ruptured viscus, UTI, kidney stone, aortic dissection, aortic aneurysm or other emergent abdominal etiology. Supportive therapy indicated with return if symptoms worsen.   ED Discharge Orders         Ordered    ondansetron (ZOFRAN ODT) 4 MG disintegrating tablet  Every 8 hours PRN     10/11/18 1511    dicyclomine (BENTYL) 20 MG tablet  2 times daily     10/11/18 1511           Carlisle Cater, PA-C 10/11/18 1519    Veryl Speak, MD 10/14/18 2320

## 2018-10-11 NOTE — ED Triage Notes (Signed)
Reports recurrent abdominal pain. States he uses marijuana to help his Sx. States pain got worse on Friday night and he was "pouring sweat". He is on methadone. States he used heroin of Friday night bc he felt so bad but it didn't help. States he goes to [facility] for his methadone and needs his dose adjusted but they won't be able to do so without an ekg. States he has not used heroin in a year and half prior to Friday night but the pain was severe and he didn't know what to do

## 2018-11-27 ENCOUNTER — Emergency Department (HOSPITAL_COMMUNITY): Payer: Self-pay

## 2018-11-27 ENCOUNTER — Emergency Department (HOSPITAL_COMMUNITY)
Admission: EM | Admit: 2018-11-27 | Discharge: 2018-11-27 | Disposition: A | Discharge status: Home / Self Care | Payer: Self-pay | Attending: Emergency Medicine | Admitting: Emergency Medicine

## 2018-11-27 ENCOUNTER — Encounter (HOSPITAL_COMMUNITY): Payer: Self-pay | Admitting: Emergency Medicine

## 2018-11-27 DIAGNOSIS — S41011A Laceration without foreign body of right shoulder, initial encounter: Secondary | ICD-10-CM | POA: Insufficient documentation

## 2018-11-27 DIAGNOSIS — Z23 Encounter for immunization: Secondary | ICD-10-CM | POA: Insufficient documentation

## 2018-11-27 DIAGNOSIS — Y998 Other external cause status: Secondary | ICD-10-CM | POA: Insufficient documentation

## 2018-11-27 DIAGNOSIS — Y9389 Activity, other specified: Secondary | ICD-10-CM | POA: Insufficient documentation

## 2018-11-27 DIAGNOSIS — F909 Attention-deficit hyperactivity disorder, unspecified type: Secondary | ICD-10-CM | POA: Insufficient documentation

## 2018-11-27 DIAGNOSIS — F17210 Nicotine dependence, cigarettes, uncomplicated: Secondary | ICD-10-CM | POA: Insufficient documentation

## 2018-11-27 DIAGNOSIS — F1210 Cannabis abuse, uncomplicated: Secondary | ICD-10-CM | POA: Insufficient documentation

## 2018-11-27 DIAGNOSIS — Y92410 Unspecified street and highway as the place of occurrence of the external cause: Secondary | ICD-10-CM | POA: Insufficient documentation

## 2018-11-27 LAB — COMPREHENSIVE METABOLIC PANEL
ALT: 13 U/L (ref 0–44)
AST: 18 U/L (ref 15–41)
Albumin: 3.9 g/dL (ref 3.5–5.0)
Alkaline Phosphatase: 77 U/L (ref 38–126)
Anion gap: 10 (ref 5–15)
BUN: 9 mg/dL (ref 6–20)
CO2: 23 mmol/L (ref 22–32)
Calcium: 9.5 mg/dL (ref 8.9–10.3)
Chloride: 104 mmol/L (ref 98–111)
Creatinine, Ser: 1.01 mg/dL (ref 0.61–1.24)
GFR calc Af Amer: >60 mL/min (ref >60)
GFR calc non Af Amer: >60 mL/min (ref >60)
Glucose, Bld: 114 mg/dL — ABNORMAL HIGH (ref 70–99)
Potassium: 3.8 mmol/L (ref 3.5–5.1)
Sodium: 137 mmol/L (ref 135–145)
Total Bilirubin: 1.2 mg/dL (ref 0.3–1.2)
Total Protein: 7 g/dL (ref 6.5–8.1)

## 2018-11-27 LAB — CBC WITH DIFFERENTIAL/PLATELET
Abs Immature Granulocytes: 0.04 10*3/uL (ref 0.00–0.07)
Basophils Absolute: 0 10*3/uL (ref 0.0–0.1)
Basophils Relative: 0 %
Eosinophils Absolute: 0 10*3/uL (ref 0.0–0.5)
Eosinophils Relative: 0 %
HCT: 45.4 % (ref 39.0–52.0)
Hemoglobin: 15.3 g/dL (ref 13.0–17.0)
Immature Granulocytes: 0 %
Lymphocytes Relative: 15 %
Lymphs Abs: 1.3 10*3/uL (ref 0.7–4.0)
MCH: 28.6 pg (ref 26.0–34.0)
MCHC: 33.7 g/dL (ref 30.0–36.0)
MCV: 84.9 fL (ref 80.0–100.0)
Monocytes Absolute: 0.4 10*3/uL (ref 0.1–1.0)
Monocytes Relative: 5 %
Neutro Abs: 7.2 10*3/uL (ref 1.7–7.7)
Neutrophils Relative %: 80 %
Platelets: 185 10*3/uL (ref 150–400)
RBC: 5.35 MIL/uL (ref 4.22–5.81)
RDW: 12.9 % (ref 11.5–15.5)
WBC: 9 10*3/uL (ref 4.0–10.5)
nRBC: 0 % (ref 0.0–0.2)

## 2018-11-27 LAB — POCT I-STAT EG7
Acid-Base Excess: 1 mmol/L (ref 0.0–2.0)
Bicarbonate: 25.2 mmol/L (ref 20.0–28.0)
Calcium, Ion: 1.17 mmol/L (ref 1.15–1.40)
HCT: 45 % (ref 39.0–52.0)
Hemoglobin: 15.3 g/dL (ref 13.0–17.0)
O2 Saturation: 100 %
Potassium: 3.8 mmol/L (ref 3.5–5.1)
Sodium: 138 mmol/L (ref 135–145)
TCO2: 26 mmol/L (ref 22–32)
pCO2, Ven: 38.8 mmHg — ABNORMAL LOW (ref 44.0–60.0)
pH, Ven: 7.421 (ref 7.250–7.430)
pO2, Ven: 168 mmHg — ABNORMAL HIGH (ref 32.0–45.0)

## 2018-11-27 LAB — I-STAT CREATININE, ED: Creatinine, Ser: 0.9 mg/dL (ref 0.61–1.24)

## 2018-11-27 MED ORDER — FENTANYL CITRATE (PF) 100 MCG/2ML IJ SOLN
50.0000 ug | Freq: Once | INTRAMUSCULAR | Status: AC
  Administered 2018-11-27: 09:00:00 50 ug via INTRAVENOUS
  Filled 2018-11-27: qty 2

## 2018-11-27 MED ORDER — METHADONE HCL 10 MG PO TABS
115.0000 mg | ORAL_TABLET | Freq: Once | ORAL | Status: AC
  Administered 2018-11-27: 115 mg via ORAL
  Filled 2018-11-27: qty 23
  Filled 2018-11-27: qty 12

## 2018-11-27 MED ORDER — TETANUS-DIPHTH-ACELL PERTUSSIS 5-2.5-18.5 LF-MCG/0.5 IM SUSP
0.5000 mL | Freq: Once | INTRAMUSCULAR | Status: AC
  Administered 2018-11-27: 12:00:00 0.5 mL via INTRAMUSCULAR
  Filled 2018-11-27: qty 0.5

## 2018-11-27 MED ORDER — LIDOCAINE-EPINEPHRINE (PF) 2 %-1:200000 IJ SOLN
20.0000 mL | Freq: Once | INTRAMUSCULAR | Status: AC
  Administered 2018-11-27: 20 mL
  Filled 2018-11-27: qty 20

## 2018-11-27 MED ORDER — IOHEXOL 300 MG/ML  SOLN
100.0000 mL | Freq: Once | INTRAMUSCULAR | Status: AC | PRN
  Administered 2018-11-27: 10:00:00 100 mL via INTRAVENOUS

## 2018-11-27 MED ORDER — LIDOCAINE-EPINEPHRINE-TETRACAINE (LET) SOLUTION
3.0000 mL | Freq: Once | NASAL | Status: AC
  Administered 2018-11-27: 13:00:00 3 mL via TOPICAL
  Filled 2018-11-27 (×2): qty 3

## 2018-11-27 MED ORDER — CEPHALEXIN 500 MG PO CAPS: 500.0000 mg | ORAL_CAPSULE | Freq: Four times a day (QID) | ORAL | 0 refills | Status: DC

## 2018-11-27 MED ORDER — LIDOCAINE-EPINEPHRINE-TETRACAINE (LET) SOLUTION
3.0000 mL | Freq: Once | NASAL | Status: DC
  Filled 2018-11-27: qty 3

## 2018-11-27 MED ORDER — LIDOCAINE-EPINEPHRINE (PF) 2 %-1:200000 IJ SOLN
10.0000 mL | Freq: Once | INTRAMUSCULAR | Status: AC
  Administered 2018-11-27: 10 mL
  Filled 2018-11-27: qty 20

## 2018-11-27 NOTE — ED Notes (Signed)
Pt concerned about receiving his methadone, states he wanted to leave AMA. Pt spoke to provider and registered nurse about his concern. The pt stood up and removed himself from the monitor as he stated he wanted to leave. Provider notified, was already in contact with the clinic, a plan was created by the provider for the pt to receive his methadone from the clinic at a later time because of the emergent situation. Pt reattached to the monitor and placed in a position of comfort with the bed locked, it its lowest position, and call bell within reach.

## 2018-11-27 NOTE — ED Notes (Signed)
Patient transported to CT 
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
## 2018-11-27 NOTE — ED Notes (Signed)
Pt verbalized understanding about wound management and verbalized understanding to return to the ED in one week for staple removal

## 2018-12-04 ENCOUNTER — Other Ambulatory Visit: Payer: Self-pay

## 2018-12-04 ENCOUNTER — Emergency Department (HOSPITAL_COMMUNITY)
Admission: EM | Admit: 2018-12-04 | Discharge: 2018-12-04 | Disposition: A | Discharge status: Home / Self Care | Payer: Self-pay | Attending: Emergency Medicine | Admitting: Emergency Medicine

## 2018-12-04 DIAGNOSIS — Z5189 Encounter for other specified aftercare: Secondary | ICD-10-CM | POA: Insufficient documentation

## 2018-12-04 DIAGNOSIS — S41111D Laceration without foreign body of right upper arm, subsequent encounter: Secondary | ICD-10-CM | POA: Insufficient documentation

## 2018-12-04 DIAGNOSIS — F1290 Cannabis use, unspecified, uncomplicated: Secondary | ICD-10-CM | POA: Insufficient documentation

## 2018-12-04 DIAGNOSIS — F17210 Nicotine dependence, cigarettes, uncomplicated: Secondary | ICD-10-CM | POA: Insufficient documentation

## 2018-12-04 MED ORDER — BACITRACIN ZINC 500 UNIT/GM EX OINT
TOPICAL_OINTMENT | Freq: Once | CUTANEOUS | Status: AC
  Administered 2018-12-04: 12:00:00 via TOPICAL
  Filled 2018-12-04: qty 0.9

## 2018-12-04 NOTE — Discharge Instructions (Signed)
Continue taking antibiotics as prescribed.  Keep wound clean and dry.  Gently wash with soap and water and apply bacitracin and Neosporin to prevent infection.  Return the emergency department on Monday, 12/07/18 for wound recheck and suture removal.  Return sooner if you develop any worsening pain, active drainage from the site, fevers or any other worsening or concerning symptoms.
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
## 2018-12-07 ENCOUNTER — Emergency Department (HOSPITAL_COMMUNITY)
Admission: EM | Admit: 2018-12-07 | Discharge: 2018-12-07 | Disposition: A | Discharge status: Home / Self Care | Payer: Self-pay | Attending: Emergency Medicine | Admitting: Emergency Medicine

## 2018-12-07 DIAGNOSIS — Z79899 Other long term (current) drug therapy: Secondary | ICD-10-CM | POA: Insufficient documentation

## 2018-12-07 DIAGNOSIS — F17210 Nicotine dependence, cigarettes, uncomplicated: Secondary | ICD-10-CM | POA: Insufficient documentation

## 2018-12-07 DIAGNOSIS — S41111D Laceration without foreign body of right upper arm, subsequent encounter: Secondary | ICD-10-CM | POA: Insufficient documentation

## 2018-12-07 DIAGNOSIS — Z4802 Encounter for removal of sutures: Secondary | ICD-10-CM | POA: Insufficient documentation
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
## 2018-12-07 NOTE — ED Triage Notes (Signed)
Pt here for staple removal. Wounds approximated and no drainage. Scabbing in places. Edges approximated

## 2018-12-07 NOTE — Discharge Instructions (Addendum)
Please read attached information. If you experience any new or worsening signs or symptoms please return to the emergency room for evaluation.  °

## 2018-12-07 NOTE — ED Notes (Signed)
Pt verbalizes understanding of d/c instructions. Pt ambulatory at d/c with all belongings.   

## 2020-04-13 ENCOUNTER — Emergency Department (HOSPITAL_COMMUNITY): Payer: Self-pay

## 2020-04-13 ENCOUNTER — Encounter (HOSPITAL_COMMUNITY): Payer: Self-pay | Admitting: Emergency Medicine

## 2020-04-13 ENCOUNTER — Emergency Department (HOSPITAL_COMMUNITY)
Admission: EM | Admit: 2020-04-13 | Discharge: 2020-04-14 | Disposition: A | Discharge status: Home / Self Care | Payer: Self-pay | Attending: Emergency Medicine | Admitting: Emergency Medicine

## 2020-04-13 DIAGNOSIS — R61 Generalized hyperhidrosis: Secondary | ICD-10-CM | POA: Insufficient documentation

## 2020-04-13 DIAGNOSIS — Z20822 Contact with and (suspected) exposure to covid-19: Secondary | ICD-10-CM | POA: Insufficient documentation

## 2020-04-13 DIAGNOSIS — R062 Wheezing: Secondary | ICD-10-CM | POA: Insufficient documentation

## 2020-04-13 DIAGNOSIS — R059 Cough, unspecified: Secondary | ICD-10-CM

## 2020-04-13 DIAGNOSIS — R110 Nausea: Secondary | ICD-10-CM

## 2020-04-13 DIAGNOSIS — N3000 Acute cystitis without hematuria: Secondary | ICD-10-CM | POA: Insufficient documentation

## 2020-04-13 DIAGNOSIS — F17210 Nicotine dependence, cigarettes, uncomplicated: Secondary | ICD-10-CM | POA: Insufficient documentation

## 2020-04-13 DIAGNOSIS — R112 Nausea with vomiting, unspecified: Secondary | ICD-10-CM | POA: Insufficient documentation

## 2020-04-13 LAB — RESP PANEL BY RT-PCR (FLU A&B, COVID) ARPGX2
Influenza A by PCR: NEGATIVE
Influenza B by PCR: NEGATIVE
SARS Coronavirus 2 by RT PCR: NEGATIVE

## 2020-04-13 LAB — URINALYSIS, ROUTINE W REFLEX MICROSCOPIC
Bilirubin Urine: NEGATIVE
Glucose, UA: NEGATIVE mg/dL
Hgb urine dipstick: NEGATIVE
Ketones, ur: 20 mg/dL — AB
Nitrite: NEGATIVE
Protein, ur: NEGATIVE mg/dL
Specific Gravity, Urine: 1.027 (ref 1.005–1.030)
pH: 5 (ref 5.0–8.0)

## 2020-04-13 LAB — COMPREHENSIVE METABOLIC PANEL
ALT: 26 U/L (ref 0–44)
AST: 27 U/L (ref 15–41)
Albumin: 4 g/dL (ref 3.5–5.0)
Alkaline Phosphatase: 84 U/L (ref 38–126)
Anion gap: 13 (ref 5–15)
BUN: 13 mg/dL (ref 6–20)
CO2: 24 mmol/L (ref 22–32)
Calcium: 9.6 mg/dL (ref 8.9–10.3)
Chloride: 98 mmol/L (ref 98–111)
Creatinine, Ser: 1.03 mg/dL (ref 0.61–1.24)
GFR, Estimated: >60 mL/min (ref >60)
Glucose, Bld: 110 mg/dL — ABNORMAL HIGH (ref 70–99)
Potassium: 3.8 mmol/L (ref 3.5–5.1)
Sodium: 135 mmol/L (ref 135–145)
Total Bilirubin: 0.7 mg/dL (ref 0.3–1.2)
Total Protein: 7.4 g/dL (ref 6.5–8.1)

## 2020-04-13 LAB — CBC
HCT: 44.3 % (ref 39.0–52.0)
Hemoglobin: 15 g/dL (ref 13.0–17.0)
MCH: 28.9 pg (ref 26.0–34.0)
MCHC: 33.9 g/dL (ref 30.0–36.0)
MCV: 85.4 fL (ref 80.0–100.0)
Platelets: 212 10*3/uL (ref 150–400)
RBC: 5.19 MIL/uL (ref 4.22–5.81)
RDW: 13.3 % (ref 11.5–15.5)
WBC: 9.4 10*3/uL (ref 4.0–10.5)
nRBC: 0 % (ref 0.0–0.2)

## 2020-04-13 LAB — LIPASE, BLOOD: Lipase: 24 U/L (ref 11–51)

## 2020-04-13 MED ORDER — ONDANSETRON HCL 4 MG/2ML IJ SOLN: 4.0000 mg | Freq: Once | INTRAMUSCULAR | Status: DC

## 2020-04-13 MED ORDER — ONDANSETRON 4 MG PO TBDP
4.0000 mg | ORAL_TABLET | Freq: Once | ORAL | Status: AC
  Administered 2020-04-13: 4 mg via ORAL
  Filled 2020-04-13: qty 1

## 2020-04-13 MED ORDER — LACTATED RINGERS IV BOLUS: 1000.0000 mL | Freq: Once | INTRAVENOUS | Status: DC

## 2020-04-13 MED ORDER — CEPHALEXIN 500 MG PO CAPS: 500.0000 mg | ORAL_CAPSULE | Freq: Four times a day (QID) | ORAL | 0 refills | Status: AC

## 2020-04-13 NOTE — ED Triage Notes (Signed)
Pt reports nausea and vomiting since yesterday, chills off and on for the past few months. Pt does endorse he is currently on methadone for heroin recovery, has had elevated creatnine for the past few months as well as "foamy foul smelling urine."

## 2020-04-13 NOTE — ED Provider Notes (Signed)
[facility] EMERGENCY DEPARTMENT Provider Note   CSN: [id number] Arrival date & time: 04/13/20  1036     History Chief Complaint  Patient presents with  . Nausea    Gage Weant is a 29 y.o. male.  HPI Patient is a 29 year old male with a history of ADHD, anxiety/depression, prior heroin abuse now on methadone who presents to ED with complaints of subjective fever/chills, nausea/vomiting, and URI symptoms.  Patient states he is very concerned that over the last several months he has been having "episodes" once or twice a month in which he "feels the heat radiating off my body" but also feels "freezing cold."  He has had frequent sweats.  Over the last week he has started to have sore throat, runny nose, and cough. States his wife has measured his temp but it has been low, 95-66F.  He notes that his son and another family member have both also been ill, but both he and the other family member have taken multiple negative COVID tests.  Patient is unvaccinated against COVID-19.  He states he has been seen at outside ED for his symptoms prior, but reports no abnormalities found on outside workup labs.    Past Medical History:  Diagnosis Date  . ADHD (attention deficit hyperactivity disorder)   . Anxiety   . Depression   . Heroin abuse (HCC) 09/04/2016  . Mood disorder [facility])     Patient Active Problem List   Diagnosis Date Noted  . Heroin abuse (HCC) 09/04/2016    History reviewed. No pertinent surgical history.     Family History  Problem Relation Age of Onset  . Diabetes Father   . Hypertension Father     Social History   Tobacco Use  . Smoking status: Current Every Day Smoker    Packs/day: 0.50    Types: Cigarettes  . Smokeless tobacco: Former Clinical biochemist  . Vaping Use: Former  Substance Use Topics  . Alcohol use: Not Currently    Comment: daily  . Drug use: Yes    Types: Marijuana    Comment: former heroin    Home Medications Prior  to Admission medications   Medication Sig Start Date End Date Taking? Authorizing Provider  cephALEXin (KEFLEX) 500 MG capsule Take 1 capsule (500 mg total) by mouth 4 (four) times daily for 7 days. 04/13/20 04/20/20 Yes Corliss Blacker, MD  METHADONE HCL PO Take 115 mg by mouth daily.     [provider]  dicyclomine (BENTYL) 20 MG tablet Take 1 tablet (20 mg total) by mouth 2 (two) times daily. Patient not taking: Reported on 11/27/2018 10/11/18 11/27/18  Renne Crigler, PA-C    Allergies    Patient has no known allergies.  Review of Systems   Review of Systems  Constitutional: Positive for appetite change (Decreased appetite last few days), chills and fever (Subjective).  HENT: Positive for rhinorrhea and sore throat. Negative for ear pain.   Eyes: Negative for pain and visual disturbance.  Respiratory: Positive for cough. Negative for shortness of breath.   Cardiovascular: Negative for chest pain and leg swelling.  Gastrointestinal: Positive for nausea and vomiting (1 episode today). Negative for abdominal pain and diarrhea.  Genitourinary: Negative for dysuria and hematuria.       Notes foamy, foul-smelling urine. Concerned that his creatinine is reportedly elevated per his counselor from the methadone clinic  Musculoskeletal: Positive for myalgias. Negative for gait problem.  Skin: Negative for color change and rash.  Neurological: Negative for syncope and light-headedness.  Psychiatric/Behavioral: Negative for confusion and suicidal ideas.  All other systems reviewed and are negative.   Physical Exam Updated Vital Signs BP 112/65   Pulse 84   Temp 97.6 F (36.4 C) (Oral)   Resp 18   Ht 6' (1.829 m)   Wt 90.7 kg   SpO2 96%   BMI 27.12 kg/m   Physical Exam Vitals and nursing note reviewed.  Constitutional:      Appearance: He is well-developed and well-nourished. He is ill-appearing and diaphoretic.  HENT:     Head: Normocephalic and atraumatic.     Nose: Nose  normal.     Mouth/Throat:     Mouth: Mucous membranes are moist.     Pharynx: Oropharynx is clear.  Eyes:     General: No scleral icterus.    Extraocular Movements: Extraocular movements intact.  Cardiovascular:     Rate and Rhythm: Normal rate and regular rhythm.     Pulses: Normal pulses.     Heart sounds: No murmur heard. No friction rub. No gallop.   Pulmonary:     Effort: Pulmonary effort is normal. No respiratory distress.     Breath sounds: Wheezing present. No rhonchi.  Abdominal:     Palpations: Abdomen is soft.     Tenderness: There is no abdominal tenderness. There is no guarding or rebound.  Musculoskeletal:        General: No edema.     Cervical back: Neck supple.     Right lower leg: No edema.     Left lower leg: No edema.  Skin:    General: Skin is warm.  Neurological:     Mental Status: He is alert.     Comments: Alert, grossly oriented, moves all extremities spontaneously  Psychiatric:        Mood and Affect: Mood and affect normal.        Behavior: Behavior normal.     ED Results / Procedures / Treatments   Labs (all labs ordered are listed, but only abnormal results are displayed) Labs Reviewed  COMPREHENSIVE METABOLIC PANEL - Abnormal; Notable for the following components:      Result Value   Glucose, Bld 110 (*)    All other components within normal limits  URINALYSIS, ROUTINE W REFLEX MICROSCOPIC - Abnormal; Notable for the following components:   Color, Urine AMBER (*)    APPearance HAZY (*)    Ketones, ur 20 (*)    Leukocytes,Ua SMALL (*)    Bacteria, UA RARE (*)    All other components within normal limits  RESP PANEL BY RT-PCR (FLU A&B, COVID) ARPGX2  LIPASE, BLOOD  CBC    EKG None  Radiology DG Chest 1 View  Result Date: 04/13/2020 CLINICAL DATA:  Nausea, vomiting, and chills. EXAM: CHEST  1 VIEW COMPARISON:  12/17/2010 FINDINGS: The heart size and mediastinal contours are within normal limits. Both lungs are clear. The  visualized skeletal structures are unremarkable. IMPRESSION: No active disease. Electronically Signed   By: Burman Nieves M.D.   On: 04/13/2020 23:35    Procedures Procedures (including critical care time)  Medications Ordered in ED Medications  ondansetron (ZOFRAN-ODT) disintegrating tablet 4 mg (4 mg Oral Given 04/13/20 2245)  ondansetron (ZOFRAN-ODT) disintegrating tablet 8 mg (8 mg Oral Given 04/14/20 2423)    ED Course  I have reviewed the triage vital signs and the nursing notes.  Pertinent labs & imaging results that were available during my  care of the patient were reviewed by me and considered in my medical decision making (see chart for details).    MDM Rules/Calculators/A&P                         29 year old male with nausea/vomiting, rhinorrhea/sore throat/cough, sweats. Pt is noticeably diaphoretic on exam but he is hemodynamically stable. I am most suspicious for viral URI given multiple sick family members with similar respiratory symptoms, but differential includes pneumonia, panic attacks (as patient says he feels overwhelming sense of anxiety during these frequent episodes of sweating), UTI.  Labs reviewed and notable for UA concerning for infection.  CBC, CMP, lipase are all unremarkable. Imaging interpreted by radiology and images personally reviewed.  CXR shows no active cardiopulmonary disease.  Patient has remained hemodynamically stable throughout his ED visit.  He is afebrile here in the department and reportedly has been at home as well when measured.  He is not tachycardic and has no leukocytosis.  His urine does appear to be infected; will treat for UTI with Keflex. COVID and flu testing is negative.  At this time, pt appears safe for discharge with strict return precautions provided and instructions for follow up given. Recommended ongoing follow up with PCP for his diaphoresis. Pt voiced understanding. Pt discharged in stable condition.  Final Clinical  Impression(s) / ED Diagnoses Final diagnoses:  Nausea  Acute cystitis without hematuria    Rx / DC Orders ED Discharge Orders         Ordered    cephALEXin (KEFLEX) 500 MG capsule  4 times daily        04/13/20 2250           Corliss Blacker, MD 04/14/20 0106    Virgina Norfolk, DO 04/17/20 [phone]

## 2020-04-13 NOTE — ED Provider Notes (Signed)
I have personally seen and examined the patient. I have reviewed the documentation on PMH/FH/Soc Hx. I have discussed the plan of care with the resident and patient.  I have reviewed and agree with the resident's documentation. Please see associated encounter note.  Briefly, the patient is a 29 y.o. male here with nausea, viral symptoms.  He has had fever and chills.  He is on methadone for prior heroin abuse.  He denies any recent substance abuse.  He has been consistent with taking his methadone.  Had a negative Covid test at home.  Overall suspect that he is a viral process.  No abdominal tenderness.  Lab work is overall unremarkable.  We will get a chest x-ray and checking for Covid and influenza.  We will get Zofran for nausea.  Anticipate discharge home.  This chart was dictated using voice recognition software.  Despite best efforts to proofread,  errors can occur which can change the documentation meaning.     EKG Interpretation None         Virgina Norfolk, DO 04/13/20 2245

## 2020-04-13 NOTE — Discharge Instructions (Addendum)
Thank you for choosing [facility] for your care today.  To do: I have sent a prescription for Keflex to your pharmacy to treat UTI. Please follow up with your primary care doctor in the next few days. If you do not have a PCP you are established with, you can call [phone]  or [phone]  to access [facility] Find A Doctor service. You can also visit [email or web address] Please come back to the Emergency Department if you have shortness of breath, chest pain, confusion/mental status changes, if you have so much nausea/vomiting that you cannot keep down fluids, or if you have any other symptoms that worry you.  Take care. Hope you start feeling better soon.  Corliss Blacker, MD Emergency Medicine

## 2020-04-14 MED ORDER — ONDANSETRON 4 MG PO TBDP
8.0000 mg | ORAL_TABLET | Freq: Once | ORAL | Status: AC
  Administered 2020-04-14: 8 mg via ORAL
  Filled 2020-04-14: qty 2

## 2021-08-07 IMAGING — CR DG CHEST 1V
3 series · 3 of 3 positions shown · non-contrast
Comparison: 12/17/2010

CLINICAL DATA: Nausea, vomiting, and chills.

EXAM:
CHEST  1 VIEW

[chest ap (1 of 3)]
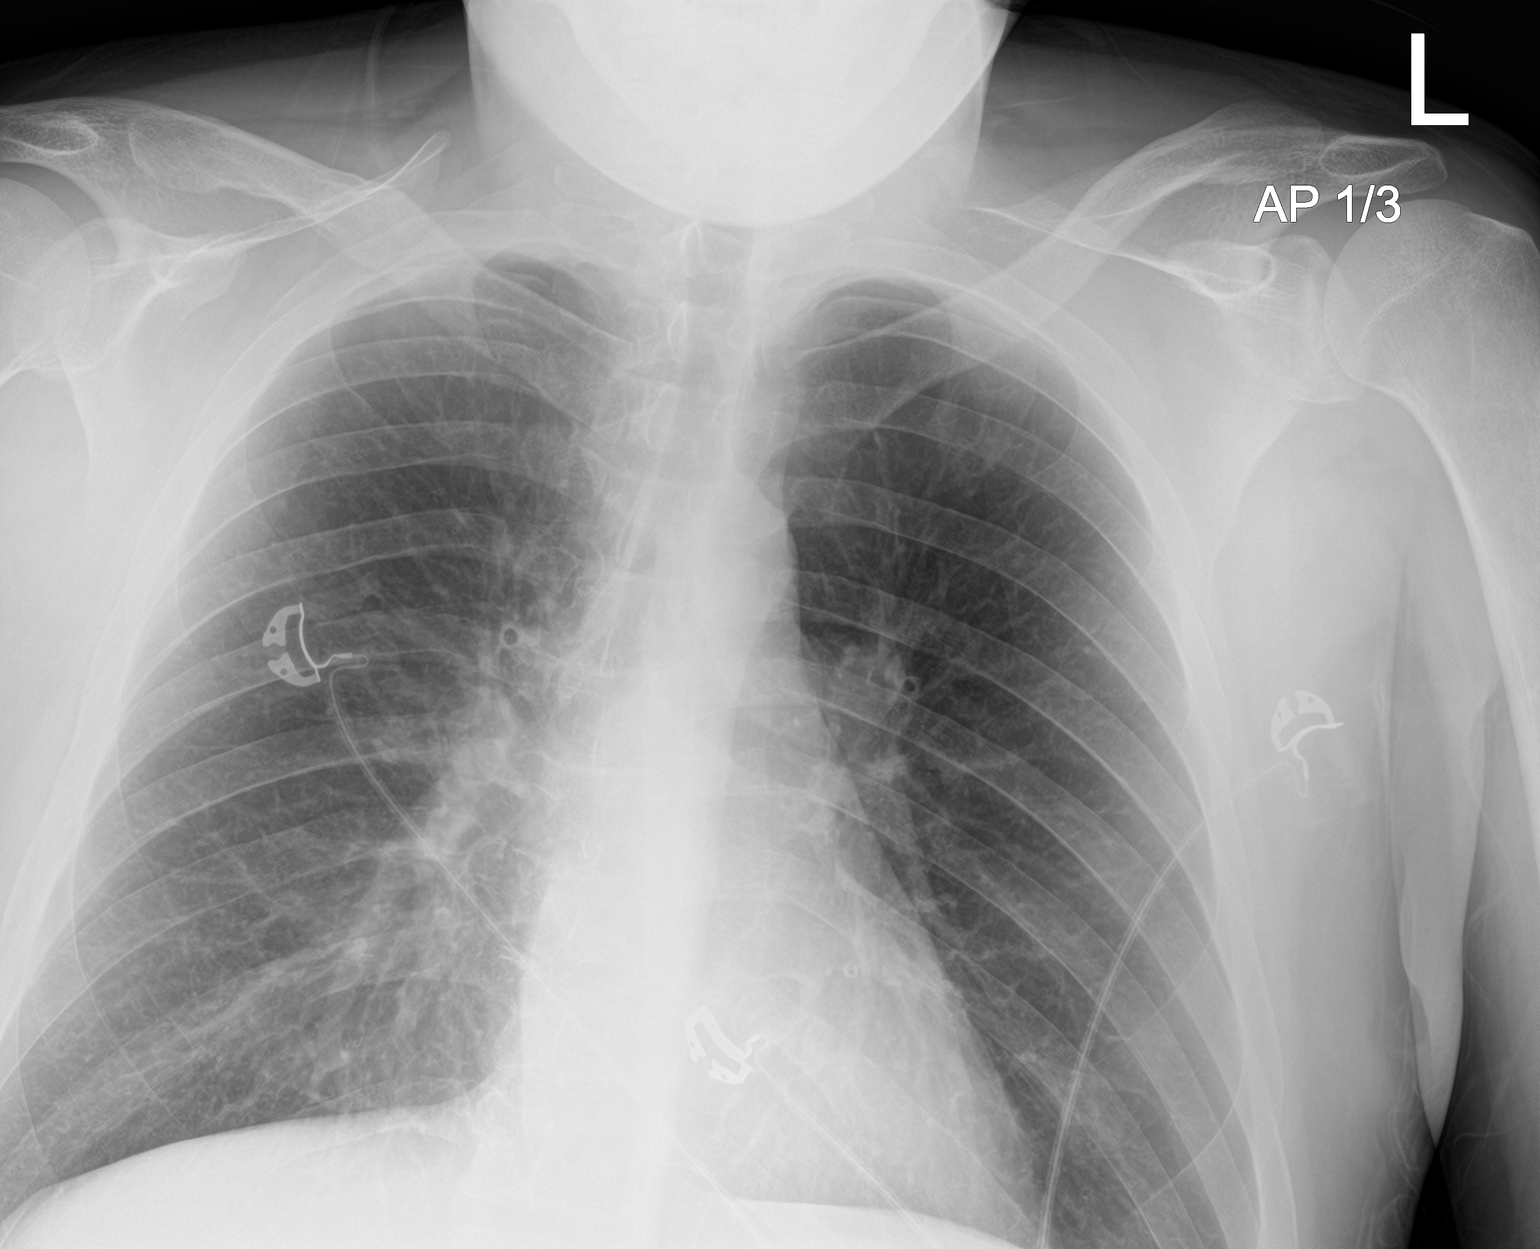

[chest ap (2 of 3)]
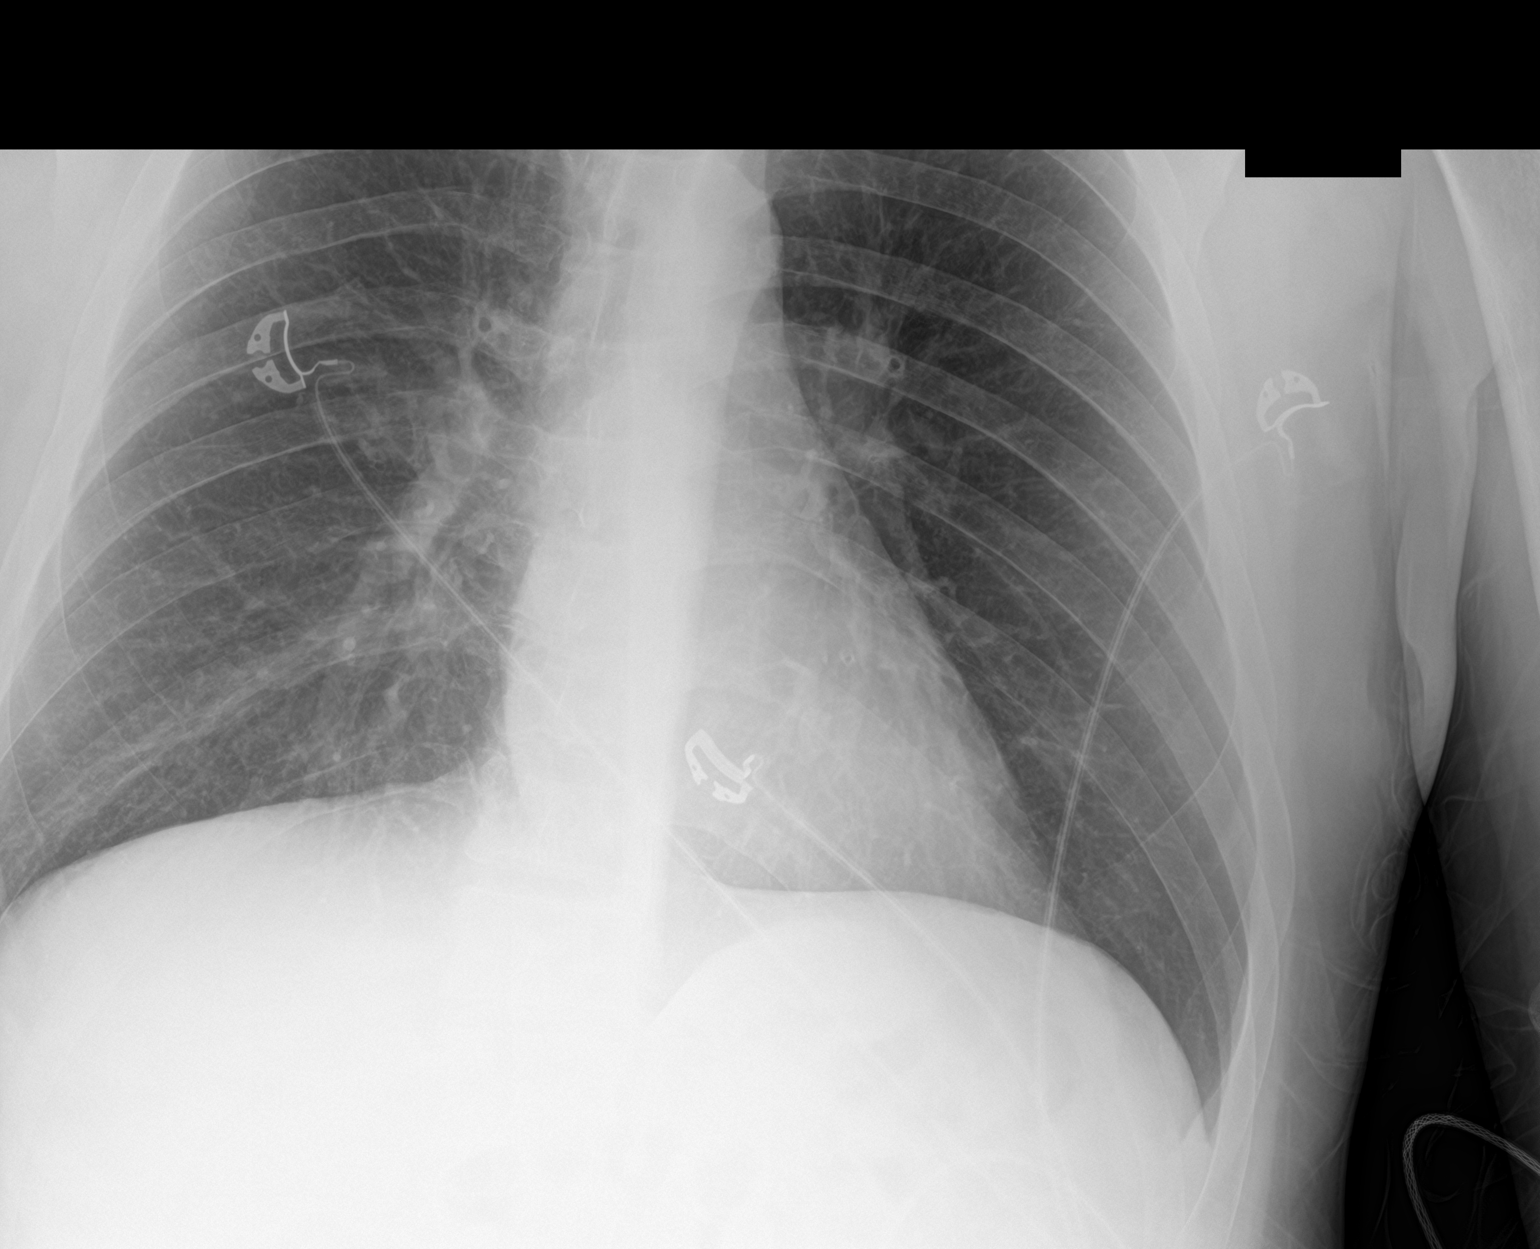

[chest ap (3 of 3)]
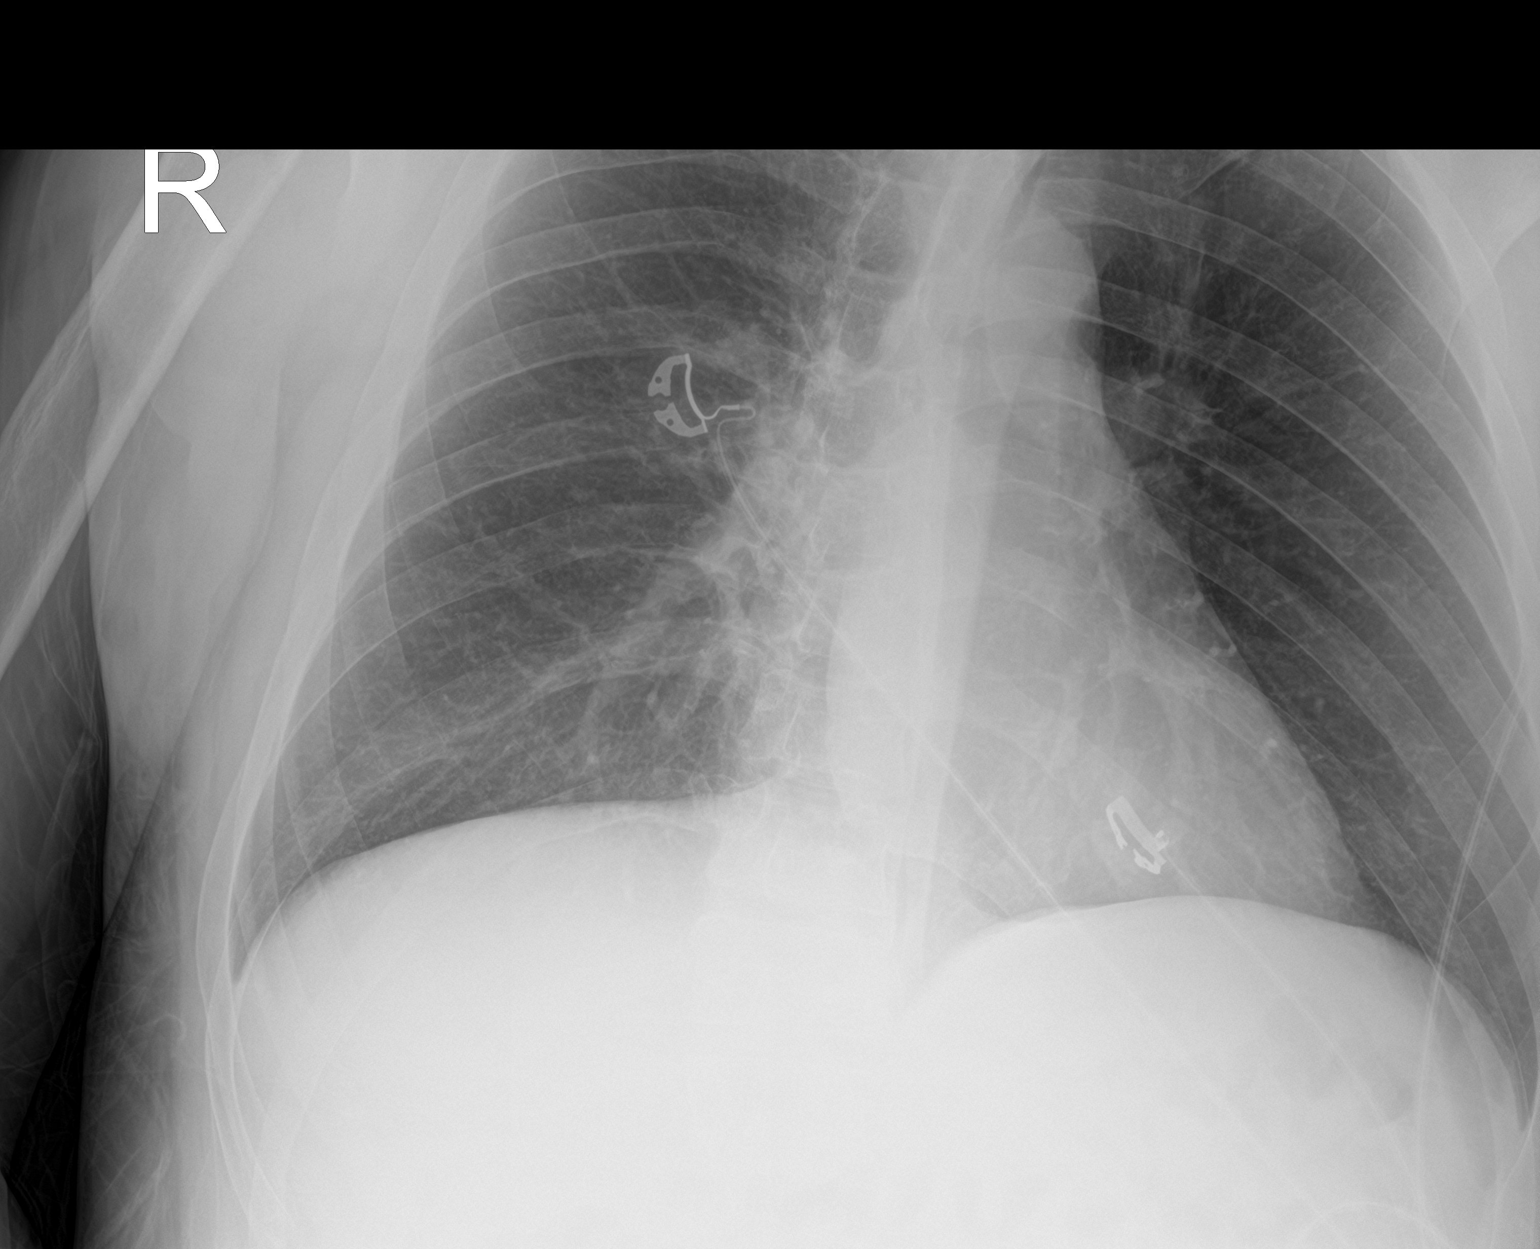

[3 of 3 positions shown; findings below may reference images not displayed]

FINDINGS: The heart size and mediastinal contours are within normal limits.
Both lungs are clear. The visualized skeletal structures are
unremarkable.
IMPRESSION: No active disease.

## 2023-02-06 ENCOUNTER — Other Ambulatory Visit (HOSPITAL_BASED_OUTPATIENT_CLINIC_OR_DEPARTMENT_OTHER): Payer: Self-pay

## 2023-02-06 MED ORDER — IBUPROFEN 800 MG PO TABS
800.0000 mg | ORAL_TABLET | Freq: Three times a day (TID) | ORAL | 0 refills | Status: DC | PRN
  Filled 2023-02-06: qty 21, 7d supply, fill #0

## 2023-02-06 MED ORDER — AMOXICILLIN 500 MG PO CAPS
500.0000 mg | ORAL_CAPSULE | Freq: Three times a day (TID) | ORAL | 0 refills | Status: DC
  Filled 2023-02-06: qty 21, 7d supply, fill #0

## 2024-02-16 ENCOUNTER — Ambulatory Visit (HOSPITAL_COMMUNITY)
Admission: EM | Admit: 2024-02-16 | Discharge: 2024-02-16 | Disposition: A | Discharge status: Home / Self Care | Attending: Psychiatry | Admitting: Psychiatry

## 2024-02-16 ENCOUNTER — Ambulatory Visit (HOSPITAL_COMMUNITY): Admission: EM | Admit: 2024-02-16 | Discharge: 2024-02-16 | Disposition: A | Discharge status: Home / Self Care

## 2024-02-16 DIAGNOSIS — F419 Anxiety disorder, unspecified: Secondary | ICD-10-CM

## 2024-02-16 DIAGNOSIS — G4700 Insomnia, unspecified: Secondary | ICD-10-CM | POA: Insufficient documentation

## 2024-02-16 DIAGNOSIS — F331 Major depressive disorder, recurrent, moderate: Secondary | ICD-10-CM

## 2024-02-16 DIAGNOSIS — F1191 Opioid use, unspecified, in remission: Secondary | ICD-10-CM | POA: Insufficient documentation

## 2024-02-16 DIAGNOSIS — F1111 Opioid abuse, in remission: Secondary | ICD-10-CM | POA: Insufficient documentation

## 2024-02-16 DIAGNOSIS — F17210 Nicotine dependence, cigarettes, uncomplicated: Secondary | ICD-10-CM | POA: Insufficient documentation

## 2024-02-16 MED ORDER — METHADONE HCL 10 MG PO TABS: 20.0000 mg | ORAL_TABLET | Freq: Every day | ORAL | Status: AC
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
## 2024-02-16 NOTE — Discharge Instructions (Signed)
 Open access clinic TOMORROW: Follow up with [facility] Residents Only WITHOUT private insurance.  Walk-in hours for open access (medication management and therapy) are Monday - Friday 8 am to 11 am. Appointments are limited, so please arrive BEFORE 6:45 AM. Upon arrival, please complete the form on the clipboard located at the front desk. If there are no clipboards available, all appointments have been filled for that day.  [facility] [address] [place]  [phone]
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
## 2024-02-16 NOTE — Discharge Summary (Signed)
 Pt left facility with plan to come back. Pt ws not triaged or seen by provider.

## 2024-02-17 ENCOUNTER — Encounter (HOSPITAL_COMMUNITY): Payer: Self-pay

## 2024-02-17 ENCOUNTER — Ambulatory Visit (INDEPENDENT_AMBULATORY_CARE_PROVIDER_SITE_OTHER)

## 2024-02-17 DIAGNOSIS — F902 Attention-deficit hyperactivity disorder, combined type: Secondary | ICD-10-CM

## 2024-02-17 DIAGNOSIS — F1121 Opioid dependence, in remission: Secondary | ICD-10-CM

## 2024-02-17 DIAGNOSIS — F1220 Cannabis dependence, uncomplicated: Secondary | ICD-10-CM

## 2024-02-17 DIAGNOSIS — F1994 Other psychoactive substance use, unspecified with psychoactive substance-induced mood disorder: Secondary | ICD-10-CM

## 2024-02-17 DIAGNOSIS — F439 Reaction to severe stress, unspecified: Secondary | ICD-10-CM
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
## 2024-03-16 ENCOUNTER — Ambulatory Visit (HOSPITAL_COMMUNITY)

## 2024-04-29 ENCOUNTER — Ambulatory Visit (HOSPITAL_COMMUNITY)

## 2024-05-06 ENCOUNTER — Ambulatory Visit (HOSPITAL_COMMUNITY)
